# Patient Record
Sex: Male | Born: 1965 | Race: White | Hispanic: No | Marital: Single | Smoking: Current every day smoker
Health system: Southern US, Community
[De-identification: ages and names within clinical notes are randomized; demographics above are authoritative.]

## PROBLEM LIST (undated history)

## (undated) DIAGNOSIS — K449 Diaphragmatic hernia without obstruction or gangrene: Secondary | ICD-10-CM

## (undated) DIAGNOSIS — K2970 Gastritis, unspecified, without bleeding: Secondary | ICD-10-CM

## (undated) DIAGNOSIS — K2270 Barrett's esophagus without dysplasia: Secondary | ICD-10-CM

## (undated) DIAGNOSIS — K5792 Diverticulitis of intestine, part unspecified, without perforation or abscess without bleeding: Secondary | ICD-10-CM

## (undated) DIAGNOSIS — F329 Major depressive disorder, single episode, unspecified: Secondary | ICD-10-CM

## (undated) DIAGNOSIS — K219 Gastro-esophageal reflux disease without esophagitis: Secondary | ICD-10-CM

## (undated) DIAGNOSIS — J45909 Unspecified asthma, uncomplicated: Secondary | ICD-10-CM

## (undated) DIAGNOSIS — F32A Depression, unspecified: Secondary | ICD-10-CM

## (undated) DIAGNOSIS — F419 Anxiety disorder, unspecified: Secondary | ICD-10-CM

## (undated) DIAGNOSIS — I10 Essential (primary) hypertension: Secondary | ICD-10-CM

## (undated) HISTORY — DX: Unspecified asthma, uncomplicated: J45.909

## (undated) HISTORY — PX: CHOLECYSTECTOMY: SHX55

## (undated) HISTORY — DX: Gastro-esophageal reflux disease without esophagitis: K21.9

## (undated) HISTORY — DX: Essential (primary) hypertension: I10

## (undated) HISTORY — DX: Barrett's esophagus without dysplasia: K22.70

---

## 2003-11-21 ENCOUNTER — Emergency Department (HOSPITAL_COMMUNITY): Admission: EM | Admit: 2003-11-21 | Discharge: 2003-11-22 | Payer: Self-pay | Admitting: Emergency Medicine

## 2008-04-20 ENCOUNTER — Emergency Department (HOSPITAL_COMMUNITY): Admission: EM | Admit: 2008-04-20 | Discharge: 2008-04-21 | Payer: Self-pay | Admitting: Emergency Medicine

## 2008-05-30 ENCOUNTER — Emergency Department (HOSPITAL_COMMUNITY): Admission: EM | Admit: 2008-05-30 | Discharge: 2008-05-30 | Payer: Self-pay | Admitting: Emergency Medicine

## 2008-06-15 ENCOUNTER — Ambulatory Visit: Payer: Self-pay | Admitting: Hematology and Oncology

## 2008-07-16 ENCOUNTER — Emergency Department (HOSPITAL_COMMUNITY): Admission: EM | Admit: 2008-07-16 | Discharge: 2008-07-17 | Payer: Self-pay | Admitting: Emergency Medicine

## 2008-07-17 ENCOUNTER — Ambulatory Visit: Payer: Self-pay | Admitting: Psychiatry

## 2008-07-17 ENCOUNTER — Inpatient Hospital Stay (HOSPITAL_COMMUNITY): Admission: EM | Admit: 2008-07-17 | Discharge: 2008-07-19 | Payer: Self-pay | Admitting: Psychiatry

## 2010-10-04 IMAGING — CT CT ANGIO CHEST
2 of 6 series · 19 of 36 positions shown · IV contrast (APPLIED)
Comparison: Chest radiography same day

CLINICAL DATA: Short of breath.  Right chest pain.  Smoking
history.  Elevated white count.  Abnormal D-dimer.

CT ANGIOGRAPHY CHEST
TECHNIQUE: Multidetector CT imaging of the chest using the
standard protocol during bolus administration of intravenous
contrast. Multiplanar reconstructed images including MIPs were
obtained and reviewed to evaluate the vascular anatomy.
Contrast: 80 ml [id number]

[Series 7: pe 1.0 b40f thins for pacs · axial · 0.84mm/px · z∈[-114,+136]mm · 18 of 278 slices shown]
[im 14/278  lung]
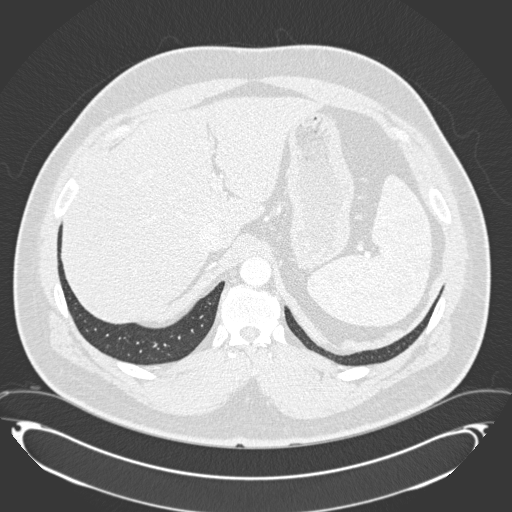
[im 28/278  mediastinal]
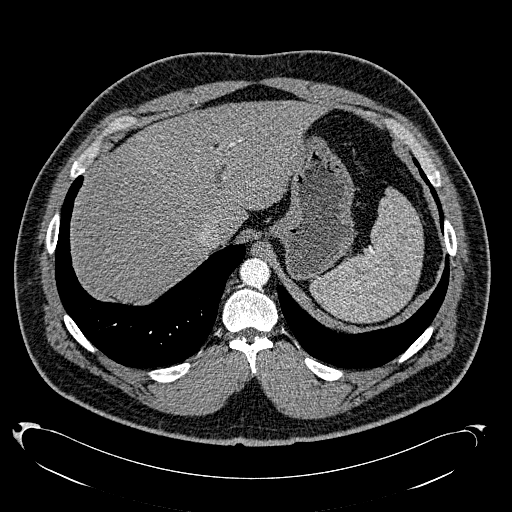
[im 42/278  lung]
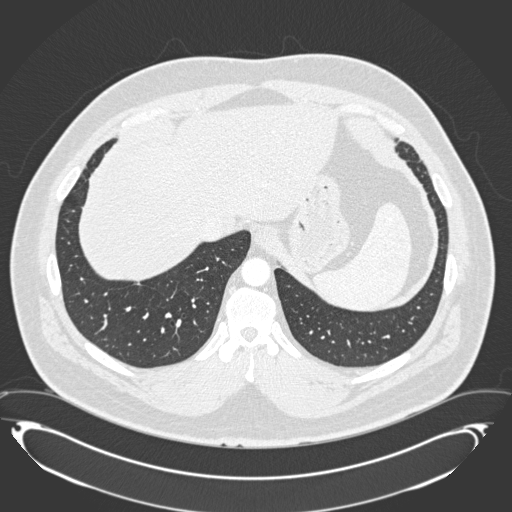
[im 56/278  mediastinal]
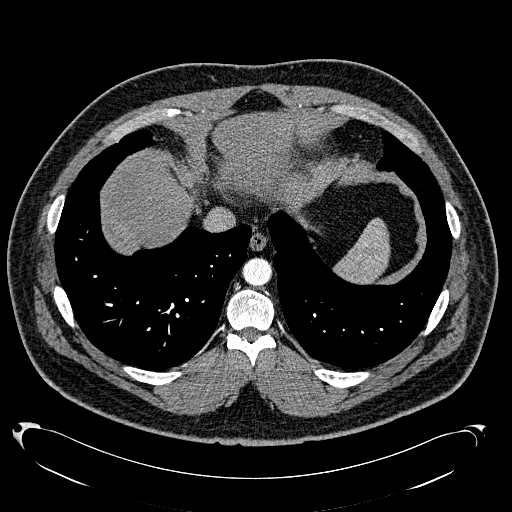
[im 70/278  lung]
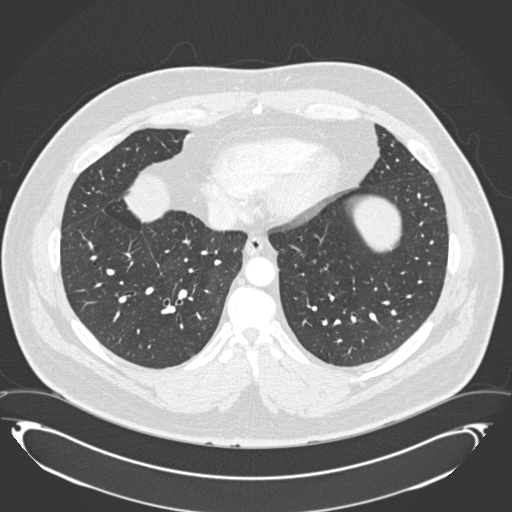
[im 84/278  mediastinal]
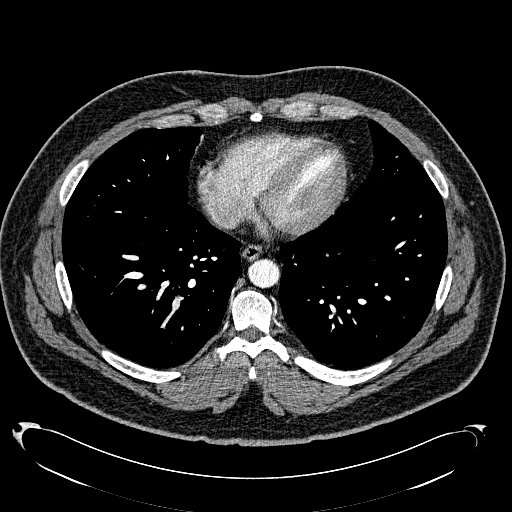
[im 97/278  lung]
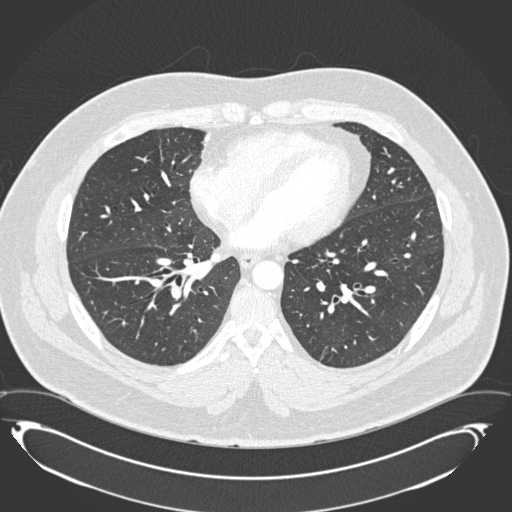
[im 111/278  mediastinal]
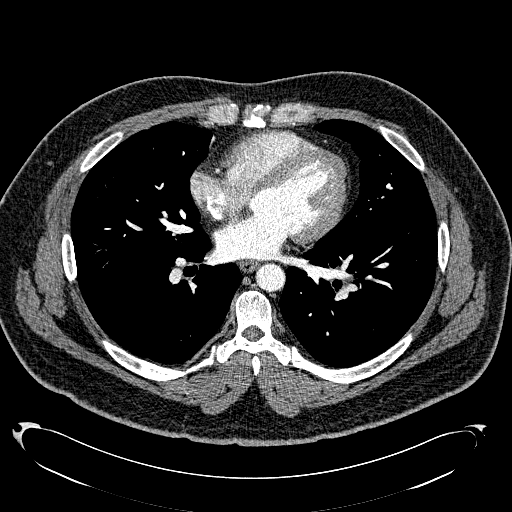
[im 125/278  lung]
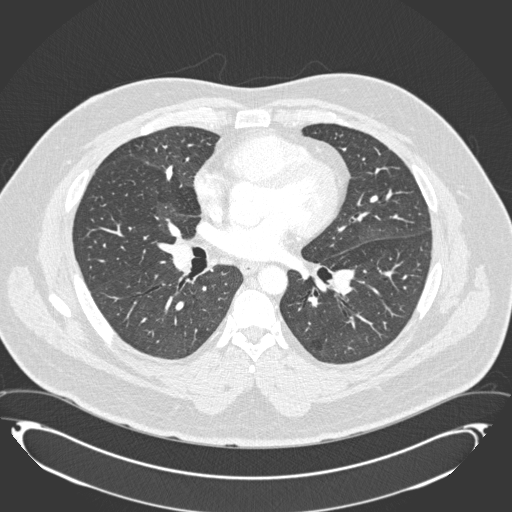
[im 153/278  mediastinal]
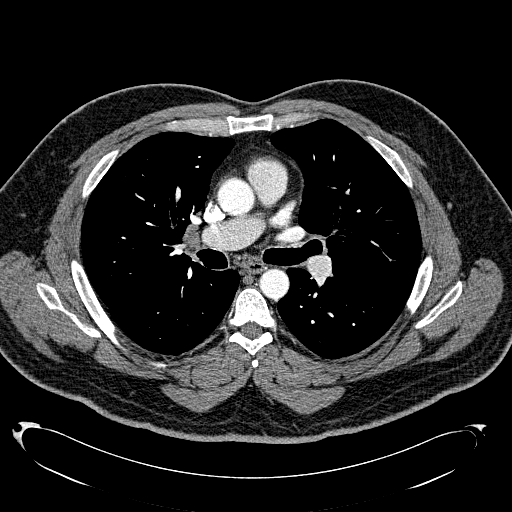
[im 167/278  lung]
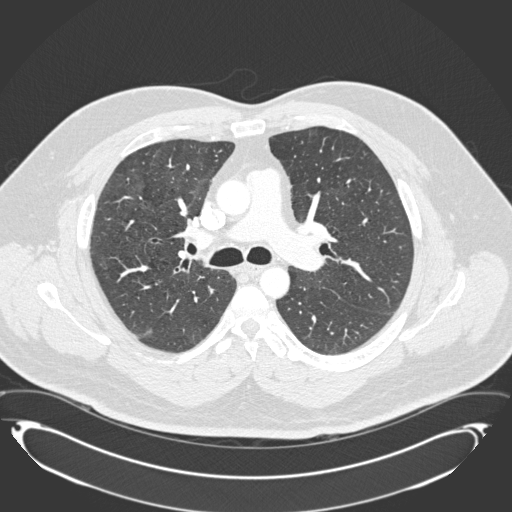
[im 181/278  mediastinal]
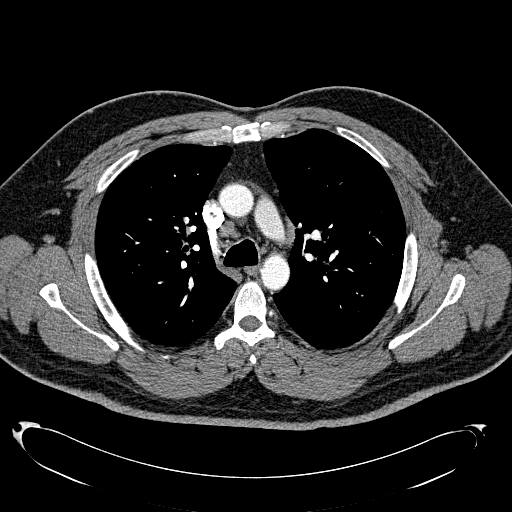
[im 194/278  lung]
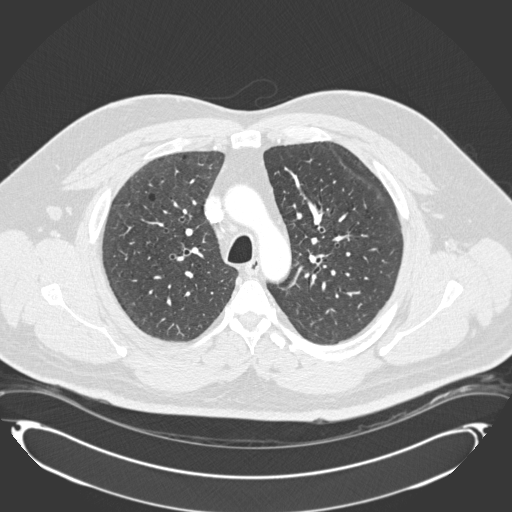
[im 208/278  mediastinal]
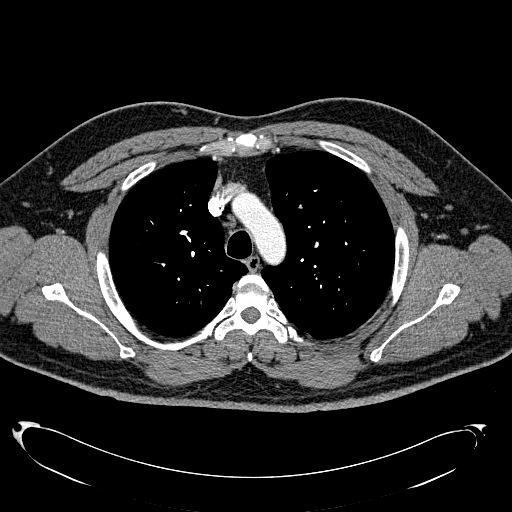
[im 222/278  lung]
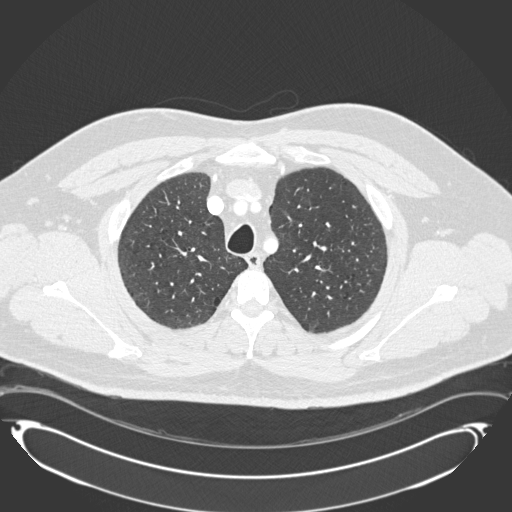
[im 236/278  mediastinal]
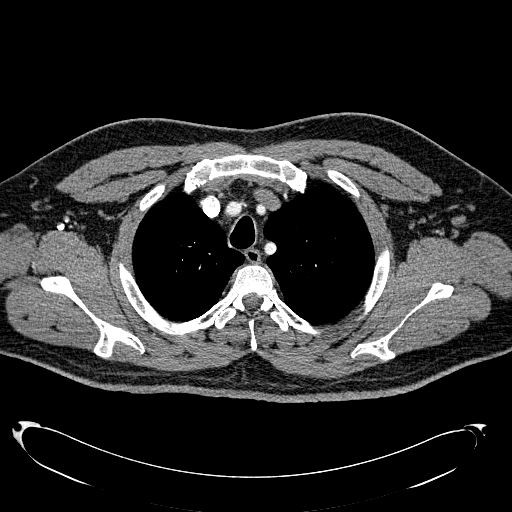
[im 250/278  lung]
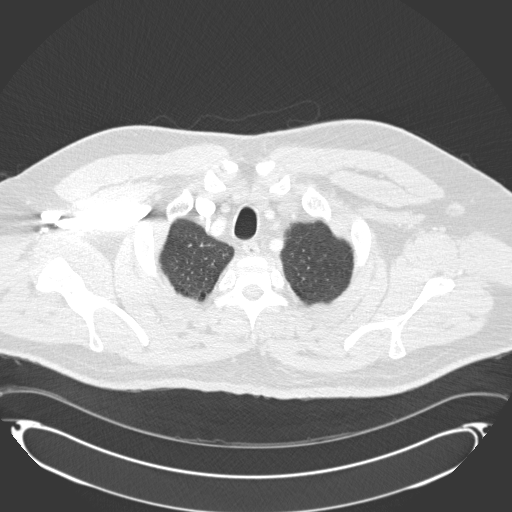
[im 264/278  mediastinal]
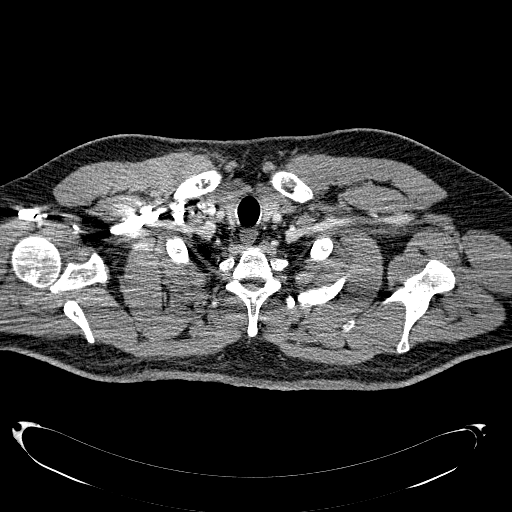

[Series 604: <mpr thick range> · coronal · 0.84mm/px · 1 of 114 slices shown]
[im 57/114  mediastinal]
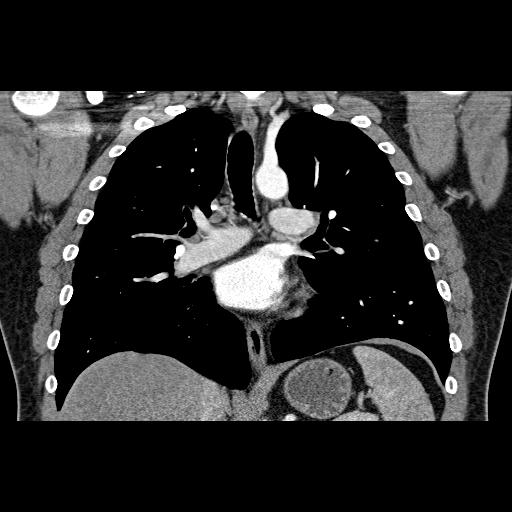

[19 of 36 positions shown; findings below may reference images not displayed]

FINDINGS: Pulmonary arterial opacification there is moderate.  No
filling defects are seen to suggest emboli.  The aorta appears
normal.  No pleural or pericardial fluid.  There are no
pathologically enlarged mediastinal or hilar lymph nodes.  There is
early emphysema and there is pulmonary opacity probably related to
respiratory bronchiolitis of smoking.  No identifiable
consolidation, collapse or mass lesion.  No osseous lesions seen.
Scans in the upper abdomen are unremarkable.
IMPRESSION: No acute process.  Chronic pulmonary findings related to  smoking
including early emphysema and areas of hazy opacity consistent with
respiratory bronchiolitis of smoking.

## 2011-01-06 NOTE — H&P (Signed)
Bryan Collins, Bryan Collins                ACCOUNT NO.:  [id number]   MEDICAL RECORD NO.:  [id number]          PATIENT TYPE:  IPS   LOCATION:  0507                          FACILITY:  [facility]   PHYSICIAN:  Bryan Collins, M.D.      DATE OF BIRTH:  Dec 27, 1965   DATE OF ADMISSION:  07/16/2008  DATE OF DISCHARGE:                       PSYCHIATRIC ADMISSION ASSESSMENT   IDENTIFICATION:  A 45 year old Caucasian male.  This is an involuntary  admission.   HISTORY OF PRESENT ILLNESS:  First [facility] admission for this 45 year old  who presented in the emergency room reporting that he has intentionally  drunk Raid, sprang it from a can into his mouth.  Endorsed intent to  harm himself, said that he has been very angry with his family, has been  arguing and destroyed his grandmother's house where he resides this  weekend saying that he wanted his parents to understand the kind of pain  and loneliness that he has been suffering [place] and no means for social contact.  He is not currently  under the care of a psychiatrist, is prescribed Effexor XR 75 mg daily  by his primary care practitioner for depression.  The patient's mother  has reported that he did break several things on the front porch and has  been threatening to kill myself for months.  The patient did raise has  voice to security guards in the emergency room and was petitioned when  he threatened to leave.  The patient reports that for the past 7 years  he has been living in his grandmother's house along with his twin  brother.  The house was purchased for them from his grandmother's estate  by his mother after the grandmother died.  Prior to that, his parents  were paying for he and his brother to live in crack motels.  Denying  homicidal thoughts.  He denies substance abuse.  He endorses several  months of increased anger and irritability, feeling lonely, appetite has  been decreased.  He has lost  approximately 25 pounds in the past 3  months, increased tearful with isolating himself, no interest in life  and becoming more irritable.   PAST PSYCHIATRIC HISTORY:  First inpatient psychiatric admission.  Has  been treated by his primary care practitioner for depression.  Denies  any other hospitalizations or prior treatment.  Denies a history of  substance abuse.  He endorses a history of prior suicide attempts  including twice.  Says that he overdosed one time in the distant past  because his girlfriend refused to have sex with him, and has also been  depressed in the past related to family problems and conflicts.   SOCIAL HISTORY:  Single Caucasian male, never married.  No children .  living with his identical twin brother for the past 7-1/2 years in his  grandmother's [place].  They receive food stamps, basic education,  currently unemployed.  Mother apparently manages his medications.  Other  source of income is unclear.   FAMILY HISTORY:  He does have an identical twin brother, also unemployed  and disabled and lives in the [place] with him.   ALCOHOL AND DRUG HISTORY:  He denies substance abuse, currently smokes  two and a half packs per day of tobacco.   MEDICAL HISTORY:  Primary care practitioner is Dr. Leslee Collins.   MEDICAL PROBLEMS:  None   CURRENT MEDICATIONS:  Current medications have not been validated with  his mother yet who manages his medications.  He reports Klonopin 1 mg  t.i.d., Effexor XR 75 mg daily.   DRUG ALLERGIES:  Penicillin.   POSITIVE PHYSICAL FINDINGS:  Physical exam was done in the emergency  room as noted in the record.  He did have some gagging from the  insecticide that he had swallowed.  It showed no signs of respiratory  depression.  Chemistries all within normal limits.  Liver enzymes  normal.  Alcohol level less than 5.  CBC:  WBC 13.5, hemoglobin 18.6,  hematocrit 56.0 and platelets 171,000.  His urine drug screen was  negative for all  substances.   MENTAL STATUS EXAM:  Blunt affect rather grim affect.  Fully alert,  cooperative, in full contact with reality.  Speech is logical, relevant,  unremarkable in form.  Mood irritable.  Thought process logical,  coherent.  Insight is adequate.  Endorsing suicidal thought.  No  homicidal thoughts.  Memory is intact, fully oriented, asking for help,  has been cooperative here with staff and been participating in groups.  Insight seems limited, willing to consider medications to help with his  mood and temperament.  Asking for help.   DIAGNOSES:  AXIS I:  Mood disorder NOS.  AXIS II:  Deferred.  AXIS III:  Status post insecticide ingestion.  AXIS IV:  Severe problems with social isolation.  AXIS V:  Current 48,  past year not known.   PLAN:  Voluntarily admit him to alleviate some of his depression and  agitation.  We are going to continue his Effexor.  At this point, we  will add Depakote 250 mg now 500 mg at bedtime, and will contact his  mother to validate his medications.  Meanwhile, we are going to check an  RPR, a TSH and free T4.      Bryan Collins, N.P.      Bryan [place], M.D.  Electronically Signed    MAS/MEDQ  D:  07/17/2008  T:  07/17/2008  Job:  [id number]

## 2011-01-09 NOTE — Discharge Summary (Signed)
NAMEPAVEL, GADD NO.:  [id number]   MEDICAL RECORD NO.:  [id number]          PATIENT TYPE:  IPS   LOCATION:  0507                          FACILITY:  [facility]   PHYSICIAN:  Geoffery Lyons, M.D.      DATE OF BIRTH:  [birth year]   DATE OF ADMISSION:  07/17/2008  DATE OF DISCHARGE:  07/19/2008                               DISCHARGE SUMMARY   CHIEF COMPLAINT AND PRESENT ILLNESS:  This was the first admission to  [facility] for this 45 year old male who presented in  the emergency room reporting that he had intentionally drunk Raid,  spraying it from a can into his mouth.  Endorsed intent to harm himself.  He was very angry with his family.  He has been arguing and destroyed  his grandmother's house, where he resides.  He wanted his parents to  understand the kind of pain and loneliness that he has been suffering  [place] the country with no transportation, no means for social  contact.  He is not seeing a psychiatrist at this particular time, being  prescribed Effexor by his primary physician.  The mother reports that he  did break several things on the front porch.  He has been threatening to  kill himself for months.  For the past 7 years, he has been living in  his grandmother's house, along with his twin brother.  Prior to being in  this house, his parents were pain for him and his brother to live in  crack __________.  Endorsed anger, irritability, and feeling lonely.   PAST PSYCHIATRIC HISTORY:  First inpatient.  Had been treated by his  primary care Gracieann Stannard.  No previous inpatient.  Endorsed a history of  prior suicide attempts.  Overdose one time in the distant past because  his girlfriend refused to have sex with him.   ALCOHOL AND DRUG HISTORY:  Denies any active use of any substances.   MEDICAL HISTORY:  Noncontributory.   MEDICATIONS:  1. Reports Klonopin 1 mg 3 times a day.  2. Effexor XR 75 mg per day.   PHYSICAL EXAMINATION:   Failed to show any acute findings.   LABORATORY WORKUP:  TSH 0.526.   MENTAL STATUS EXAM:  Reveals an alert, cooperative male, in full contact  with reality.  Speech was logical and relevant.  Mood irritable.  Affect  irritable.  Thought processes logical, coherent, and relevant.  Endorsed  no active suicidal or homicidal ideas, no hallucinations or delusions.  Endorsed being upset with the situation that he is in at the house with  his brother and not being able to have transportation.  A lot of  projection, not claiming responsibility for his behavior, a lot of  rationalization.   ADMITTING DIAGNOSES:  AXIS I:  Mood disorder, not otherwise specified.  AXIS II:  Personality disorder, not otherwise specified.  AXIS III:  No diagnosis.  AXIS IV:  Moderate.  AXIS V:  Upon admission 35-40.  Highest global assessment of function in  the last year 60.   COURSE IN THE HOSPITAL:  The patient was admitted.  He was started on  individual and group psychotherapy.  As already stated, he endorsed he  had been taking Klonopin 6 mg for 9 years.  As of late, he has not been  eating, feeling depressed, hopeless, helpless.  He was on Zoloft for 2  or 3 years, but it was not working anymore.  Switched to Effexor.  Admits that he has had angry violent outbursts.  Has been off for 20  days, but the depression has been getting worse.  He had been on  Cymbalta, Celexa, Seroquel. There was a family session with his mother.  Apparently, he had been worse since starting the Effexor, but he has had  anger issues for years.  He has not worked for 8 years, and the mother  pays all of his bills, as she also does for his brother.  He also  destroyed the cell phone that his mother bought for him.  The patient  has no insurance, no income, and has not attempted to work in 8 years.  He reports boredom.  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  The mother did report that she  had to stop  enabling, and the patient did not accept any of the recommendation given  by the counselor.  He claimed he wanted to get his life back together.  Endorsed he was going to go to counseling and planned to be more active,  asking about disability.  On July 19, 2008, he was in full contact  reality.  There were no active suicidal or homicidal ideations.  No  delusions.  Endorsed he was willing to pursue further treatment.  He was  placed on Depakote 250 in the morning and 2 at bedtime.  He was  maintained on the Klonopin, with recommendation that the Klonopin was to  be discontinued on an outpatient basis.   DISCHARGE DIAGNOSES:  AXIS I:  1.  Mood disorder, not otherwise  specified.  2.  Anxiety disorder, not otherwise specified.  AXIS II:  Personality disorder, not otherwise specified.  AXIS III:  No diagnosis.  AXIS IV:  Moderate.  AXIS V:  Upon discharge 50.   DISCHARGED ON:  1. Depakote 250 in the morning, and 500 at bedtime.  2. Klonopin 1 mg 1 three times a day as needed for anxiety.  3. Ambien 10 at bedtime for sleep.   FOLLOWUP:  [facility].      Geoffery Lyons, M.D.  Electronically Signed     IL/MEDQ  D:  08/15/2008  T:  08/16/2008  Job:  [id number]

## 2011-03-19 ENCOUNTER — Emergency Department (HOSPITAL_COMMUNITY)
Admission: EM | Admit: 2011-03-19 | Discharge: 2011-03-19 | Disposition: A | Discharge status: Home / Self Care | Payer: Medicaid Other | Attending: Emergency Medicine | Admitting: Emergency Medicine

## 2011-03-19 ENCOUNTER — Emergency Department (HOSPITAL_COMMUNITY): Payer: Medicaid Other

## 2011-03-19 DIAGNOSIS — R1011 Right upper quadrant pain: Secondary | ICD-10-CM | POA: Insufficient documentation

## 2011-03-19 DIAGNOSIS — Z9089 Acquired absence of other organs: Secondary | ICD-10-CM | POA: Insufficient documentation

## 2011-03-19 DIAGNOSIS — G8918 Other acute postprocedural pain: Secondary | ICD-10-CM | POA: Insufficient documentation

## 2011-03-19 LAB — COMPREHENSIVE METABOLIC PANEL
ALT: 21 U/L (ref 0–53)
Albumin: 3.4 g/dL — ABNORMAL LOW (ref 3.5–5.2)
Alkaline Phosphatase: 93 U/L (ref 39–117)
BUN: 12 mg/dL (ref 6–23)
Chloride: 103 mEq/L (ref 96–112)
Potassium: 3.7 mEq/L (ref 3.5–5.1)
Total Bilirubin: 0.3 mg/dL (ref 0.3–1.2)

## 2011-03-19 LAB — URINALYSIS, ROUTINE W REFLEX MICROSCOPIC
Hgb urine dipstick: NEGATIVE
Nitrite: NEGATIVE
Specific Gravity, Urine: 1.026 (ref 1.005–1.030)
pH: 5.5 (ref 5.0–8.0)

## 2011-03-19 LAB — DIFFERENTIAL
Lymphocytes Relative: 19 % (ref 12–46)
Lymphs Abs: 2.7 10*3/uL (ref 0.7–4.0)
Neutrophils Relative %: 74 % (ref 43–77)

## 2011-03-19 LAB — CBC
HCT: 50.1 % (ref 39.0–52.0)
MCV: 87.4 fL (ref 78.0–100.0)
Platelets: 157 10*3/uL (ref 150–400)
RBC: 5.73 MIL/uL (ref 4.22–5.81)
WBC: 14.3 10*3/uL — ABNORMAL HIGH (ref 4.0–10.5)

## 2011-03-19 LAB — LIPASE, BLOOD: Lipase: 28 U/L (ref 11–59)

## 2011-03-19 MED ORDER — IOHEXOL 300 MG/ML  SOLN
100.0000 mL | Freq: Once | INTRAMUSCULAR | Status: AC | PRN
  Administered 2011-03-19: 100 mL via INTRAVENOUS

## 2011-05-25 LAB — BLOOD GAS, ARTERIAL
FIO2: 0.21
O2 Saturation: 90.5
Patient temperature: 98.6
TCO2: 20.6
pH, Arterial: 7.379

## 2011-05-25 LAB — COMPREHENSIVE METABOLIC PANEL
Albumin: 3.6
BUN: 7
Creatinine, Ser: 1.16
Glucose, Bld: 89
Total Protein: 6.9

## 2011-05-25 LAB — CBC
HCT: 53.4 — ABNORMAL HIGH
MCHC: 34.3
MCV: 92.8
Platelets: 152
RDW: 14.7

## 2011-05-25 LAB — D-DIMER, QUANTITATIVE: D-Dimer, Quant: 0.25

## 2011-05-25 LAB — DIFFERENTIAL
Basophils Absolute: 0
Basophils Relative: 0
Eosinophils Absolute: 0.2
Eosinophils Relative: 1

## 2011-05-25 LAB — POCT CARDIAC MARKERS
CKMB, poc: <1 — ABNORMAL LOW
Myoglobin, poc: 80.8

## 2011-05-25 LAB — LIPASE, BLOOD: Lipase: 60 — ABNORMAL HIGH

## 2011-05-26 LAB — CBC
HCT: 56 — ABNORMAL HIGH
Platelets: 171
RDW: 13.7
WBC: 13.5 — ABNORMAL HIGH

## 2011-05-26 LAB — RAPID URINE DRUG SCREEN, HOSP PERFORMED
Benzodiazepines: NOT DETECTED
Cocaine: NOT DETECTED
Tetrahydrocannabinol: NOT DETECTED

## 2011-05-26 LAB — COMPREHENSIVE METABOLIC PANEL
ALT: 18
AST: 18
Albumin: 3.8
Alkaline Phosphatase: 87
BUN: 4 — ABNORMAL LOW
Chloride: 105
Potassium: 4.1
Sodium: 137
Total Bilirubin: 0.5
Total Protein: 7

## 2011-05-26 LAB — T4, FREE: Free T4: 1.03

## 2011-05-26 LAB — SALICYLATE LEVEL: Salicylate Lvl: 9.7

## 2011-05-26 LAB — DIFFERENTIAL
Basophils Absolute: 0
Basophils Relative: 0
Eosinophils Absolute: 0.1
Eosinophils Relative: 1
Monocytes Absolute: 0.8
Monocytes Relative: 6
Neutro Abs: 10.5 — ABNORMAL HIGH

## 2011-05-26 LAB — ETHANOL: Alcohol, Ethyl (B): <5

## 2011-05-26 LAB — TSH: TSH: 0.526

## 2012-03-09 ENCOUNTER — Ambulatory Visit: Payer: Medicaid Other | Admitting: Family Medicine

## 2012-03-22 ENCOUNTER — Ambulatory Visit: Payer: Medicaid Other | Admitting: Family Medicine

## 2012-04-06 ENCOUNTER — Ambulatory Visit: Payer: Medicaid Other | Admitting: Family Medicine

## 2012-04-11 ENCOUNTER — Ambulatory Visit: Payer: Medicaid Other | Admitting: Family Medicine

## 2014-05-08 ENCOUNTER — Emergency Department (HOSPITAL_COMMUNITY)
Admission: EM | Admit: 2014-05-08 | Discharge: 2014-05-08 | Disposition: A | Discharge status: Home / Self Care | Payer: Medicaid Other | Attending: Emergency Medicine | Admitting: Emergency Medicine

## 2014-05-08 ENCOUNTER — Encounter (HOSPITAL_COMMUNITY): Payer: Self-pay | Admitting: Emergency Medicine

## 2014-05-08 DIAGNOSIS — IMO0002 Reserved for concepts with insufficient information to code with codable children: Secondary | ICD-10-CM | POA: Diagnosis not present

## 2014-05-08 DIAGNOSIS — J45901 Unspecified asthma with (acute) exacerbation: Secondary | ICD-10-CM | POA: Diagnosis not present

## 2014-05-08 DIAGNOSIS — Z7982 Long term (current) use of aspirin: Secondary | ICD-10-CM | POA: Diagnosis not present

## 2014-05-08 DIAGNOSIS — R1031 Right lower quadrant pain: Secondary | ICD-10-CM | POA: Diagnosis present

## 2014-05-08 DIAGNOSIS — Z79899 Other long term (current) drug therapy: Secondary | ICD-10-CM | POA: Diagnosis not present

## 2014-05-08 DIAGNOSIS — G8929 Other chronic pain: Secondary | ICD-10-CM | POA: Diagnosis not present

## 2014-05-08 DIAGNOSIS — Z9089 Acquired absence of other organs: Secondary | ICD-10-CM | POA: Insufficient documentation

## 2014-05-08 DIAGNOSIS — F329 Major depressive disorder, single episode, unspecified: Secondary | ICD-10-CM | POA: Diagnosis not present

## 2014-05-08 DIAGNOSIS — F3289 Other specified depressive episodes: Secondary | ICD-10-CM | POA: Diagnosis not present

## 2014-05-08 DIAGNOSIS — F411 Generalized anxiety disorder: Secondary | ICD-10-CM

## 2014-05-08 DIAGNOSIS — Z880 Allergy status to penicillin: Secondary | ICD-10-CM | POA: Insufficient documentation

## 2014-05-08 DIAGNOSIS — R0602 Shortness of breath: Secondary | ICD-10-CM

## 2014-05-08 HISTORY — DX: Depression, unspecified: F32.A

## 2014-05-08 HISTORY — DX: Anxiety disorder, unspecified: F41.9

## 2014-05-08 HISTORY — DX: Major depressive disorder, single episode, unspecified: F32.9

## 2014-05-08 LAB — URINALYSIS, ROUTINE W REFLEX MICROSCOPIC
BILIRUBIN URINE: NEGATIVE
Glucose, UA: NEGATIVE mg/dL
HGB URINE DIPSTICK: NEGATIVE
KETONES UR: NEGATIVE mg/dL
Leukocytes, UA: NEGATIVE
NITRITE: NEGATIVE
PROTEIN: NEGATIVE mg/dL
Specific Gravity, Urine: 1.013 (ref 1.005–1.030)
UROBILINOGEN UA: 0.2 mg/dL (ref 0.0–1.0)
pH: 6 (ref 5.0–8.0)

## 2014-05-08 LAB — COMPREHENSIVE METABOLIC PANEL
ALBUMIN: 3.7 g/dL (ref 3.5–5.2)
ALK PHOS: 85 U/L (ref 39–117)
ALT: 15 U/L (ref 0–53)
ANION GAP: 11 (ref 5–15)
AST: 16 U/L (ref 0–37)
BILIRUBIN TOTAL: 0.3 mg/dL (ref 0.3–1.2)
BUN: 9 mg/dL (ref 6–23)
CHLORIDE: 102 meq/L (ref 96–112)
CO2: 25 mEq/L (ref 19–32)
CREATININE: 0.98 mg/dL (ref 0.50–1.35)
Calcium: 9.3 mg/dL (ref 8.4–10.5)
GLUCOSE: 86 mg/dL (ref 70–99)
POTASSIUM: 4.2 meq/L (ref 3.7–5.3)
Sodium: 138 mEq/L (ref 137–147)
Total Protein: 7.5 g/dL (ref 6.0–8.3)

## 2014-05-08 LAB — CBC WITH DIFFERENTIAL/PLATELET
BASOS PCT: 0 % (ref 0–1)
Basophils Absolute: 0 10*3/uL (ref 0.0–0.1)
Eosinophils Absolute: 0.2 10*3/uL (ref 0.0–0.7)
Eosinophils Relative: 1 % (ref 0–5)
HEMATOCRIT: 51.6 % (ref 39.0–52.0)
HEMOGLOBIN: 18.1 g/dL — AB (ref 13.0–17.0)
LYMPHS ABS: 1.9 10*3/uL (ref 0.7–4.0)
Lymphocytes Relative: 17 % (ref 12–46)
MCH: 31.2 pg (ref 26.0–34.0)
MCHC: 35.1 g/dL (ref 30.0–36.0)
MCV: 89 fL (ref 78.0–100.0)
MONO ABS: 0.8 10*3/uL (ref 0.1–1.0)
MONOS PCT: 7 % (ref 3–12)
NEUTROS ABS: 8.5 10*3/uL — AB (ref 1.7–7.7)
NEUTROS PCT: 75 % (ref 43–77)
Platelets: 157 10*3/uL (ref 150–400)
RBC: 5.8 MIL/uL (ref 4.22–5.81)
RDW: 14.2 % (ref 11.5–15.5)
WBC: 11.4 10*3/uL — ABNORMAL HIGH (ref 4.0–10.5)

## 2014-05-08 LAB — PRO B NATRIURETIC PEPTIDE: PRO B NATRI PEPTIDE: 84.3 pg/mL (ref 0–125)

## 2014-05-08 MED ORDER — MORPHINE SULFATE 4 MG/ML IJ SOLN
4.0000 mg | Freq: Once | INTRAMUSCULAR | Status: AC
  Administered 2014-05-08: 4 mg via INTRAVENOUS
  Filled 2014-05-08: qty 1

## 2014-05-08 MED ORDER — NAPROXEN 500 MG PO TABS: 500.0000 mg | ORAL_TABLET | Freq: Two times a day (BID) | ORAL | Status: DC | PRN

## 2014-05-08 MED ORDER — OXYCODONE-ACETAMINOPHEN 5-325 MG PO TABS: 1.0000 | ORAL_TABLET | Freq: Four times a day (QID) | ORAL | Status: DC | PRN

## 2014-05-08 MED ORDER — SODIUM CHLORIDE 0.9 % IV BOLUS (SEPSIS)
1000.0000 mL | Freq: Once | INTRAVENOUS | Status: AC
  Administered 2014-05-08: 1000 mL via INTRAVENOUS

## 2014-05-08 MED ORDER — OMEPRAZOLE 20 MG PO CPDR: 20.0000 mg | DELAYED_RELEASE_CAPSULE | Freq: Every day | ORAL | Status: DC

## 2014-05-08 NOTE — Discharge Instructions (Signed)
Abdominal (belly) pain can be caused by many things. Yours is likely related to your diverticulosis. You need to increase the water and fiber in your diet. Use naprosyn and norco as directed as needed for pain. Use prilosec daily to help with your reflux symptoms. Your caregiver performed an examination and possibly ordered blood/urine tests and imaging (CT scan, x-rays, ultrasound). Many cases can be observed and treated at home after initial evaluation in the emergency department. Even though you are being discharged home, abdominal pain can be unpredictable. Therefore, you need a repeated exam if your pain does not resolve, returns, or worsens. Most patients with abdominal pain don't have to be admitted to the hospital or have surgery, but serious problems like appendicitis and gallbladder attacks can start out as nonspecific pain. Many abdominal conditions cannot be diagnosed in one visit, so follow-up evaluations are very important. SEEK IMMEDIATE MEDICAL ATTENTION IF YOU DEVELOP ANY OF THE FOLLOWING SYMPTOMS:  The pain does not go away or becomes severe.   A temperature above 101 develops.   Repeated vomiting occurs (multiple episodes).   The pain becomes localized to portions of the abdomen. The right side could possibly be appendicitis. In an adult, the left lower portion of the abdomen could be colitis or diverticulitis.   Blood is being passed in stools or vomit (bright red or black tarry stools).   Return also if you develop chest pain, difficulty breathing, dizziness or fainting, or become confused, poorly responsive, or inconsolable (young children).  The constipation stays for more than 4 days.   There is belly (abdominal) or rectal pain.   You do not seem to be getting better.

## 2014-05-08 NOTE — ED Provider Notes (Signed)
CSN: [id number]     Arrival date & time 05/08/14  1439 History   First MD Initiated Contact with Patient 05/08/14 1552     Chief Complaint  Patient presents with  . Abdominal Pain  . Shortness of Breath     (Consider location/radiation/quality/duration/timing/severity/associated sxs/prior Treatment) HPI Comments: Bryan [place] is a 48 y.o. male with a PMHx of depression, anxiety, asthma, and diverticulosis, and a PSHx of cholecystectomy, who presents to the ED with complaints of ongoing abd pain x6 months which is improving but has not subsided fully. States he was in [place] 6 months ago and was diagnosed with diverticulitis, treated with pain meds and antibiotics, and since then he's been having ongoing abd pain intermittently without known inciting cause. Reports the pain is 6/10 sharp stabbing when it occurs, then becomes dull, usually occurs on his right lateral abdomen but will occasionally become generalized. States it's unassociated with food intake or BMs. States today he came into the ED because although the pain is improving, he's still having GI discomfort over the last few months and he's "tired of it". States he has ongoing GERD symptoms which he's been using prilosec and pepto bismol for with relief. Denies any changes in his pain, fevers, chills, CP, cough, n/v/d/c, hematochezia, hematuria, hematemesis, melena, painful BMs, rectal bleeding, recent travel or suspicious food intake. States he uses ASA daily, 325mg  x10 tabs, usually on an empty stomach. Drinks 2 pots of coffee daily. Smokes 2ppd x53yrs. Reports he's never see a GI doctor after his initial diverticulitis incident. States the pain he's been experiencing is not anywhere as severe as what it felt like 6 months ago.  Additionally, pt complains of ongoing SOB x2-3 years, occurring sporadically and intermittently, worse with smoking and anxiety. Uses advair daily, and stopped using his albuterol inhaler because he was having  jitteriness and didn't think it was helping. States he's not having any SOB at this time, but has it daily. Denies LE swelling, inspiratory pain, CP, hemoptysis, recent travel, diaphoresis, jaw/arm/back pain, weakness, or paresthesias.   Patient is a 48 y.o. male presenting with abdominal pain. The history is provided by the patient. No language interpreter was used.  Abdominal Pain Pain location:  RLQ Pain quality: stabbing   Pain radiates to:  Does not radiate Pain severity:  Mild (6/10) Onset quality:  Gradual Duration: 6 months. Timing:  Sporadic Progression:  Improving Chronicity:  Recurrent Context: not recent travel, not sick contacts and not suspicious food intake   Relieved by:  None tried Worsened by:  Nothing tried Ineffective treatments:  None tried Associated symptoms: shortness of breath (intermittent, ongoing for months)   Associated symptoms: no anorexia, no belching, no chest pain, no chills, no constipation, no cough, no diarrhea, no dysuria, no fatigue, no fever, no flatus, no hematemesis, no hematochezia, no hematuria, no melena, no nausea, no sore throat and no vomiting   Shortness of breath:    Severity:  Mild   Onset quality:  Gradual   Duration: 2-3 years.   Timing:  Sporadic   Progression:  Unchanged Risk factors: aspirin and NSAID use     Past Medical History  Diagnosis Date  . Depression   . Anxiety    Past Surgical History  Procedure Laterality Date  . Cholecystectomy     No family history on file. History  Substance Use Topics  . Smoking status: Never Smoker   . Smokeless tobacco: Not on file  . Alcohol Use: No  Review of Systems  Constitutional: Negative for fever, chills and fatigue.  HENT: Negative for sore throat.   Respiratory: Positive for shortness of breath (intermittent, ongoing for months). Negative for cough and wheezing.   Cardiovascular: Negative for chest pain, palpitations and leg swelling.  Gastrointestinal: Positive for  abdominal pain. Negative for nausea, vomiting, diarrhea, constipation, blood in stool, melena, hematochezia, abdominal distention, anal bleeding, rectal pain, anorexia, flatus and hematemesis.  Genitourinary: Negative for dysuria, urgency and hematuria.  Musculoskeletal: Negative for arthralgias, back pain and myalgias.  Skin: Negative for rash.  Neurological: Negative for dizziness, weakness and numbness.  10 Systems reviewed and are negative for acute change except as noted in the HPI.     Allergies  Penicillins  Home Medications   Prior to Admission medications   Medication Sig Start Date End Date Taking? Authorizing Provider  aspirin 325 MG tablet Take 1,625 mg by mouth 2 (two) times daily.   Yes Historical Provider, MD  clonazePAM (KLONOPIN) 1 MG tablet Take 1 mg by mouth 4 (four) times daily.   Yes Historical Provider, MD  Fluticasone-Salmeterol (ADVAIR) 250-50 MCG/DOSE AEPB Inhale 1 puff into the lungs 2 (two) times daily.   Yes Historical Provider, MD  gabapentin (NEURONTIN) 400 MG capsule Take 1,200 mg by mouth daily.   Yes Historical Provider, MD  sertraline (ZOLOFT) 100 MG tablet Take 200 mg by mouth daily.   Yes Historical Provider, MD  naproxen (NAPROSYN) 500 MG tablet Take 1 tablet (500 mg total) by mouth 2 (two) times daily as needed for mild pain, moderate pain or headache (TAKE WITH MEALS.). 05/08/14   Rmani Kellogg Strupp Camprubi-Soms, PA-C  omeprazole (PRILOSEC) 20 MG capsule Take 1 capsule (20 mg total) by mouth daily. 05/08/14   Amandeep Nesmith Strupp Camprubi-Soms, PA-C  oxyCODONE-acetaminophen (PERCOCET) 5-325 MG per tablet Take 1-2 tablets by mouth every 6 (six) hours as needed for severe pain. 05/08/14   Hazle Ogburn Strupp Camprubi-Soms, PA-C   BP 131/97  Pulse 107  Temp(Src) 98 F (36.7 C) (Oral)  Resp 18  SpO2 96% Physical Exam  Nursing note and vitals reviewed. Constitutional: He is oriented to person, place, and time. Vital signs are normal. He appears well-developed and  well-nourished. No distress.  Afebrile, nontoxic, NAD  HENT:  Head: Normocephalic and atraumatic.  Mouth/Throat: Mucous membranes are normal.  Eyes: Conjunctivae and EOM are normal. Right eye exhibits no discharge. Left eye exhibits no discharge.  Neck: Normal range of motion. Neck supple.  Cardiovascular: Normal rate, regular rhythm, normal heart sounds and intact distal pulses.  Exam reveals no gallop and no friction rub.   No murmur heard. Pulmonary/Chest: Effort normal and breath sounds normal. No respiratory distress. He has no decreased breath sounds. He has no wheezes. He has no rhonchi. He has no rales.  CTAB in all lung fields  Abdominal: Soft. Normal appearance and bowel sounds are normal. He exhibits no distension. There is no tenderness. There is no rigidity, no rebound, no guarding, no CVA tenderness and no tenderness at McBurney's point.  Soft, NT/ND, no r/g/r, neg mcburney's point TTP, no CVA TTP. When asked, pt states he had some mild discomfort with palpation of R abdomen, but not very bothersome  Musculoskeletal: Normal range of motion.  Neurological: He is alert and oriented to person, place, and time.  Skin: Skin is warm, dry and intact. No rash noted.  Psychiatric: He has a normal mood and affect.    ED Course  Procedures (including critical care time) Labs Review  Labs Reviewed  CBC WITH DIFFERENTIAL - Abnormal; Notable for the following:    WBC 11.4 (*)    Hemoglobin 18.1 (*)    Neutro Abs 8.5 (*)    All other components within normal limits  COMPREHENSIVE METABOLIC PANEL  PRO B NATRIURETIC PEPTIDE  URINALYSIS, ROUTINE W REFLEX MICROSCOPIC    Imaging Review No results found.   EKG Interpretation None      MDM   Final diagnoses:  Abdominal pain, chronic, right lower quadrant  Anxiety state, unspecified  SOB (shortness of breath)    47y/o male with chronic abd pain and chronic SOB, neither is worse or different today. Completely benign abd exam and  lung exam. Currently not having many symptoms, and states overall he's improving. Discussed that imaging isn't indicated at this time given lack of change in symptoms. Labs demonstrate U/A WNL, WBC 11.4, CMP WNL, BNP neg. Doubt need for CXR given clear lung exam and no ongoing symptoms, doubt PE or other acute pulm abnormality. Doubt need for abd CT given improvement of symptoms overall. Given 1L bolus and 4mg  Morphine at pt request, with improvement of pain. Discussed having him F/up with GI for ongoing management, and get a PCP to eval his ongoing SOB. Discussed diet changes to help with diverticulosis, as well as decreasing ASA and coffee intake, using prilosec for GERD symptoms, and eating more fiber. I explained the diagnosis and have given explicit precautions to return to the ER including for any other new or worsening symptoms. The patient understands and accepts the medical plan as it's been dictated and I have answered their questions. Discharge instructions concerning home care and prescriptions have been given. The patient is STABLE and is discharged to home in good condition.  BP 143/99  Pulse 99  Temp(Src) 98 F (36.7 C) (Oral)  Resp 17  SpO2 99%  Meds ordered this encounter  Medications  . sodium chloride 0.9 % bolus 1,000 mL    Sig:   . morphine 4 MG/ML injection 4 mg    Sig:   . naproxen (NAPROSYN) 500 MG tablet    Sig: Take 1 tablet (500 mg total) by mouth 2 (two) times daily as needed for mild pain, moderate pain or headache (TAKE WITH MEALS.).    Dispense:  20 tablet    Refill:  0    Order Specific Question:  Supervising Provider    Answer:  Noemi Chapel D [[id number]]  . oxyCODONE-acetaminophen (PERCOCET) 5-325 MG per tablet    Sig: Take 1-2 tablets by mouth every 6 (six) hours as needed for severe pain.    Dispense:  10 tablet    Refill:  0    Order Specific Question:  Supervising Provider    Answer:  Noemi Chapel D [[id number]]  . omeprazole (PRILOSEC) 20 MG capsule    Sig:  Take 1 capsule (20 mg total) by mouth daily.    Dispense:  30 capsule    Refill:  0    Order Specific Question:  Supervising Provider    Answer:  OSHA, ERRICO [address]     [place], PA-C 05/08/14 1840

## 2014-05-08 NOTE — ED Notes (Signed)
Pt c/o abd pain x 6 months, pt has diverticulitis. Pt c/o SOB x 2 -3 years, pt states ha has asthma.

## 2014-05-11 NOTE — ED Provider Notes (Signed)
Medical screening examination/treatment/procedure(s) were performed by non-physician practitioner and as supervising physician I was immediately available for consultation/collaboration.   EKG Interpretation None        Houston Siren III, MD 05/11/14 1630

## 2014-11-13 ENCOUNTER — Emergency Department (HOSPITAL_COMMUNITY): Payer: Medicaid Other

## 2014-11-13 ENCOUNTER — Encounter (HOSPITAL_COMMUNITY): Payer: Self-pay | Admitting: Emergency Medicine

## 2014-11-13 ENCOUNTER — Emergency Department (HOSPITAL_COMMUNITY)
Admission: EM | Admit: 2014-11-13 | Discharge: 2014-11-13 | Disposition: A | Discharge status: Home / Self Care | Payer: Medicaid Other | Attending: Emergency Medicine | Admitting: Emergency Medicine

## 2014-11-13 DIAGNOSIS — Z79899 Other long term (current) drug therapy: Secondary | ICD-10-CM | POA: Insufficient documentation

## 2014-11-13 DIAGNOSIS — F419 Anxiety disorder, unspecified: Secondary | ICD-10-CM | POA: Insufficient documentation

## 2014-11-13 DIAGNOSIS — F329 Major depressive disorder, single episode, unspecified: Secondary | ICD-10-CM | POA: Insufficient documentation

## 2014-11-13 DIAGNOSIS — R1032 Left lower quadrant pain: Secondary | ICD-10-CM | POA: Diagnosis not present

## 2014-11-13 DIAGNOSIS — R1030 Lower abdominal pain, unspecified: Secondary | ICD-10-CM | POA: Insufficient documentation

## 2014-11-13 DIAGNOSIS — R109 Unspecified abdominal pain: Secondary | ICD-10-CM

## 2014-11-13 DIAGNOSIS — Z8719 Personal history of other diseases of the digestive system: Secondary | ICD-10-CM | POA: Insufficient documentation

## 2014-11-13 DIAGNOSIS — Z9049 Acquired absence of other specified parts of digestive tract: Secondary | ICD-10-CM | POA: Diagnosis not present

## 2014-11-13 DIAGNOSIS — Z7982 Long term (current) use of aspirin: Secondary | ICD-10-CM | POA: Diagnosis not present

## 2014-11-13 DIAGNOSIS — Z880 Allergy status to penicillin: Secondary | ICD-10-CM | POA: Diagnosis not present

## 2014-11-13 HISTORY — DX: Diverticulitis of intestine, part unspecified, without perforation or abscess without bleeding: K57.92

## 2014-11-13 LAB — CBC WITH DIFFERENTIAL/PLATELET
BASOS PCT: 0 % (ref 0–1)
Basophils Absolute: 0 10*3/uL (ref 0.0–0.1)
EOS ABS: 0.1 10*3/uL (ref 0.0–0.7)
EOS PCT: 1 % (ref 0–5)
HEMATOCRIT: 52.6 % — AB (ref 39.0–52.0)
Hemoglobin: 18.3 g/dL — ABNORMAL HIGH (ref 13.0–17.0)
LYMPHS PCT: 16 % (ref 12–46)
Lymphs Abs: 2.1 10*3/uL (ref 0.7–4.0)
MCH: 31.3 pg (ref 26.0–34.0)
MCHC: 34.8 g/dL (ref 30.0–36.0)
MCV: 89.9 fL (ref 78.0–100.0)
MONO ABS: 0.8 10*3/uL (ref 0.1–1.0)
Monocytes Relative: 6 % (ref 3–12)
Neutro Abs: 10.2 10*3/uL — ABNORMAL HIGH (ref 1.7–7.7)
Neutrophils Relative %: 77 % (ref 43–77)
PLATELETS: 136 10*3/uL — AB (ref 150–400)
RBC: 5.85 MIL/uL — ABNORMAL HIGH (ref 4.22–5.81)
RDW: 14 % (ref 11.5–15.5)
WBC: 13.2 10*3/uL — AB (ref 4.0–10.5)

## 2014-11-13 LAB — COMPREHENSIVE METABOLIC PANEL
ALBUMIN: 4 g/dL (ref 3.5–5.2)
ALK PHOS: 83 U/L (ref 39–117)
ALT: 16 U/L (ref 0–53)
AST: 23 U/L (ref 0–37)
Anion gap: 9 (ref 5–15)
BILIRUBIN TOTAL: 0.6 mg/dL (ref 0.3–1.2)
BUN: 10 mg/dL (ref 6–23)
CHLORIDE: 104 mmol/L (ref 96–112)
CO2: 24 mmol/L (ref 19–32)
Calcium: 8.7 mg/dL (ref 8.4–10.5)
Creatinine, Ser: 1.2 mg/dL (ref 0.50–1.35)
GFR calc Af Amer: 80 mL/min — ABNORMAL LOW (ref >90)
GFR calc non Af Amer: 69 mL/min — ABNORMAL LOW (ref >90)
Glucose, Bld: 98 mg/dL (ref 70–99)
POTASSIUM: 4.4 mmol/L (ref 3.5–5.1)
SODIUM: 137 mmol/L (ref 135–145)
TOTAL PROTEIN: 7.5 g/dL (ref 6.0–8.3)

## 2014-11-13 LAB — URINALYSIS, ROUTINE W REFLEX MICROSCOPIC
GLUCOSE, UA: NEGATIVE mg/dL
Hgb urine dipstick: NEGATIVE
Ketones, ur: NEGATIVE mg/dL
LEUKOCYTES UA: NEGATIVE
Nitrite: NEGATIVE
PROTEIN: NEGATIVE mg/dL
SPECIFIC GRAVITY, URINE: 1.029 (ref 1.005–1.030)
Urobilinogen, UA: 1 mg/dL (ref 0.0–1.0)
pH: 5.5 (ref 5.0–8.0)

## 2014-11-13 LAB — I-STAT CG4 LACTIC ACID, ED
LACTIC ACID, VENOUS: 0.53 mmol/L (ref 0.5–2.0)
Lactic Acid, Venous: 2.22 mmol/L (ref 0.5–2.0)

## 2014-11-13 LAB — LIPASE, BLOOD: Lipase: 39 U/L (ref 11–59)

## 2014-11-13 MED ORDER — DICYCLOMINE HCL 20 MG PO TABS: 20.0000 mg | ORAL_TABLET | Freq: Two times a day (BID) | ORAL | Status: DC

## 2014-11-13 MED ORDER — IOHEXOL 300 MG/ML  SOLN
100.0000 mL | Freq: Once | INTRAMUSCULAR | Status: AC | PRN
  Administered 2014-11-13: 100 mL via INTRAVENOUS

## 2014-11-13 MED ORDER — OXYCODONE-ACETAMINOPHEN 5-325 MG PO TABS
2.0000 | ORAL_TABLET | Freq: Once | ORAL | Status: AC
  Administered 2014-11-13: 2 via ORAL
  Filled 2014-11-13: qty 2

## 2014-11-13 MED ORDER — FENTANYL CITRATE 0.05 MG/ML IJ SOLN
50.0000 ug | Freq: Once | INTRAMUSCULAR | Status: AC
  Administered 2014-11-13: 50 ug via INTRAVENOUS
  Filled 2014-11-13: qty 2

## 2014-11-13 NOTE — ED Provider Notes (Signed)
CSN: [id number]     Arrival date & time 11/13/14  1707 History   First MD Initiated Contact with Patient 11/13/14 1808     Chief Complaint  Patient presents with  . Abdominal Pain     (Consider location/radiation/quality/duration/timing/severity/associated sxs/prior Treatment) HPI Comments: The patient is a 49 year old male, he reports having a history of diverticulitis approximately one year ago, he has also had a cholecystectomy in the past. He presents to the hospital with a complaint of abdominal discomfort, he states that it is all over, it is not focal, it is associated with frequent loose stools ever since he was diagnosed last year with diverticulitis. He states that his symptoms never really went away, he has pain everyday, he is also mixed with times when he is totally pain-free. It seems to gradually be getting worse every day. It is not associated with bowel movements, urination or eating. He denies blood in the stools or dysuria. He denies abdominal distention or nausea  Patient is a 49 y.o. male presenting with abdominal pain. The history is provided by the patient.  Abdominal Pain   Past Medical History  Diagnosis Date  . Depression   . Anxiety   . Diverticulitis    Past Surgical History  Procedure Laterality Date  . Cholecystectomy     History reviewed. No pertinent family history. History  Substance Use Topics  . Smoking status: Never Smoker   . Smokeless tobacco: Not on file  . Alcohol Use: No    Review of Systems  Gastrointestinal: Positive for abdominal pain.  All other systems reviewed and are negative.     Allergies  Penicillins  Home Medications   Prior to Admission medications   Medication Sig Start Date End Date Taking? Authorizing Provider  albuterol (PROVENTIL HFA;VENTOLIN HFA) 108 (90 BASE) MCG/ACT inhaler Inhale 2 puffs into the lungs every 6 (six) hours as needed for wheezing or shortness of breath.   Yes Historical Provider, MD  aspirin  325 MG tablet Take 1,625 mg by mouth 2 (two) times daily.   Yes Historical Provider, MD  clonazePAM (KLONOPIN) 1 MG tablet Take 1 mg by mouth 2 (two) times daily.    Yes Historical Provider, MD  Fluticasone-Salmeterol (ADVAIR) 250-50 MCG/DOSE AEPB Inhale 1 puff into the lungs daily.    Yes Historical Provider, MD  gabapentin (NEURONTIN) 600 MG tablet Take 600 mg by mouth 3 (three) times daily.   Yes Historical Provider, MD  omeprazole (PRILOSEC) 20 MG capsule Take 1 capsule (20 mg total) by mouth daily. 05/08/14  Yes Mercedes Camprubi-Soms, PA-C  sertraline (ZOLOFT) 100 MG tablet Take 200 mg by mouth daily.   Yes Historical Provider, MD  dicyclomine (BENTYL) 20 MG tablet Take 1 tablet (20 mg total) by mouth 2 (two) times daily. 11/13/14   Noemi Chapel, MD  naproxen (NAPROSYN) 500 MG tablet Take 1 tablet (500 mg total) by mouth 2 (two) times daily as needed for mild pain, moderate pain or headache (TAKE WITH MEALS.). Patient not taking: Reported on 11/13/2014 05/08/14   Mercedes Camprubi-Soms, PA-C  oxyCODONE-acetaminophen (PERCOCET) 5-325 MG per tablet Take 1-2 tablets by mouth every 6 (six) hours as needed for severe pain. Patient not taking: Reported on 11/13/2014 05/08/14   Mercedes Camprubi-Soms, PA-C   BP 138/76 mmHg  Pulse 93  Temp(Src) 98.2 F (36.8 C) (Oral)  Resp 18  SpO2 96% Physical Exam  Constitutional: He appears well-developed and well-nourished. No distress.  HENT:  Head: Normocephalic and atraumatic.  Mouth/Throat:  Oropharynx is clear and moist. No oropharyngeal exudate.  Eyes: Conjunctivae and EOM are normal. Pupils are equal, round, and reactive to light. Right eye exhibits no discharge. Left eye exhibits no discharge. No scleral icterus.  Neck: Normal range of motion. Neck supple. No JVD present. No thyromegaly present.  Cardiovascular: Normal rate, regular rhythm, normal heart sounds and intact distal pulses.  Exam reveals no gallop and no friction rub.   No murmur  heard. Pulmonary/Chest: Effort normal and breath sounds normal. No respiratory distress. He has no wheezes. He has no rales.  Abdominal: Soft. Bowel sounds are normal. He exhibits no distension and no mass. There is no tenderness.  Minimal abdominal tenderness in the left lower quadrant, suprapubic area and mid abdomen, no guarding, no masses, no peritoneal signs, no Murphy sign, no pain in McBurney's point  Musculoskeletal: Normal range of motion. He exhibits no edema or tenderness.  Lymphadenopathy:    He has no cervical adenopathy.  Neurological: He is alert. Coordination normal.  Skin: Skin is warm and dry. No rash noted. No erythema.  Psychiatric: He has a normal mood and affect. His behavior is normal.  Nursing note and vitals reviewed.   ED Course  Procedures (including critical care time) Labs Review Labs Reviewed  COMPREHENSIVE METABOLIC PANEL - Abnormal; Notable for the following:    GFR calc non Af Amer 69 (*)    GFR calc Af Amer 80 (*)    All other components within normal limits  CBC WITH DIFFERENTIAL/PLATELET - Abnormal; Notable for the following:    WBC 13.2 (*)    RBC 5.85 (*)    Hemoglobin 18.3 (*)    HCT 52.6 (*)    Platelets 136 (*)    Neutro Abs 10.2 (*)    All other components within normal limits  URINALYSIS, ROUTINE W REFLEX MICROSCOPIC - Abnormal; Notable for the following:    Color, Urine AMBER (*)    Bilirubin Urine SMALL (*)    All other components within normal limits  I-STAT CG4 LACTIC ACID, ED - Abnormal; Notable for the following:    Lactic Acid, Venous 2.22 (*)    All other components within normal limits  LIPASE, BLOOD  I-STAT CG4 LACTIC ACID, ED    Imaging Review Ct Abdomen Pelvis W Contrast  11/13/2014   CLINICAL DATA:  Acute abdominal pain.  Pain increasing over 1 day.  EXAM: CT ABDOMEN AND PELVIS WITH CONTRAST  TECHNIQUE: Multidetector CT imaging of the abdomen and pelvis was performed using the standard protocol following bolus  administration of intravenous contrast.  CONTRAST:  144mL OMNIPAQUE IOHEXOL 300 MG/ML  SOLN  COMPARISON:  CT abdomen/ pelvis 03/19/2011  FINDINGS: Minimal atelectasis in the lingula. The included lung bases are otherwise clear.  Clips in the gallbladder fossa from cholecystectomy. The liver demonstrates diffusely decreased density consistent with hepatic steatosis, no focal hepatic lesion. There is no biliary dilatation. The spleen, pancreas, and adrenal glands are normal. Kidneys demonstrate symmetric enhancement and excretion. Small subcentimeter hypodensities within the left kidney, too small to characterize, likely simple cysts.  The stomach is physiologically distended. There are no dilated or thickened bowel loops. Diverticulosis of the descending and sigmoid colon, no diverticulitis. The appendix is normal.  The abdominal aorta is normal in caliber. There is no retroperitoneal adenopathy. No free air, free fluid, or intra-abdominal fluid collection.  The urinary bladder is physiologically distended. Prostate gland is normal in size. No pelvic free fluid or adenopathy.  There is mild degenerative  change in the spine, no acute or suspicious osseous abnormality.  IMPRESSION: 1. No acute abnormality in the abdomen/pelvis. 2. Diverticulosis without diverticulitis. 3. Hepatic steatosis.  Postcholecystectomy.   Electronically Signed   By: Jeb Levering M.D.   On: 11/13/2014 21:56      MDM   Final diagnoses:  Abdominal pain, acute  Abdominal pain, acute    The patient's exam is very unremarkable, could be diverticulitis though the pattern suggests that it is more chronic pain and irritable bowel syndrome given his daily pain which is intermittent, nonsurgical abdomen and relatively normal vital signs.  CT reviewed - no acute findings - VS normal - labs wnl, pt better, Rx bentyl, possible IBS  Meds given in ED:  Medications  oxyCODONE-acetaminophen (PERCOCET/ROXICET) 5-325 MG per tablet 2 tablet  (2 tablets Oral Given 11/13/14 1838)  fentaNYL (SUBLIMAZE) injection 50 mcg (50 mcg Intravenous Given 11/13/14 2009)  iohexol (OMNIPAQUE) 300 MG/ML solution 100 mL (100 mLs Intravenous Contrast Given 11/13/14 2132)    New Prescriptions   DICYCLOMINE (BENTYL) 20 MG TABLET    Take 1 tablet (20 mg total) by mouth 2 (two) times daily.        Noemi Chapel, MD 11/13/14 [phone]

## 2014-11-13 NOTE — ED Notes (Signed)
Pt reports he was diagnosed with diverticulits a year ago and has had abd pain ever since. Pt reports pain got worse last night. No v/d. Pt feels bloated.

## 2014-11-13 NOTE — Discharge Instructions (Signed)
Please call your doctor for a followup appointment within 24-48 hours. When you talk to your doctor please let them know that you were seen in the emergency department and have them acquire all of your records so that they can discuss the findings with you and formulate a treatment plan to fully care for your new and ongoing problems. ° °Abdominal Pain °Many things can cause abdominal pain. Usually, abdominal pain is not caused by a disease and will improve without treatment. It can often be observed and treated at home. Your health care provider will do a physical exam and possibly order blood tests and X-rays to help determine the seriousness of your pain. However, in many cases, more time must pass before a clear cause of the pain can be found. Before that point, your health care provider may not know if you need more testing or further treatment. °HOME CARE INSTRUCTIONS  °Monitor your abdominal pain for any changes. The following actions may help to alleviate any discomfort you are experiencing: °· Only take over-the-counter or prescription medicines as directed by your health care provider. °· Do not take laxatives unless directed to do so by your health care provider. °· Try a clear liquid diet (broth, tea, or water) as directed by your health care provider. Slowly move to a bland diet as tolerated. °SEEK MEDICAL CARE IF: °· You have unexplained abdominal pain. °· You have abdominal pain associated with nausea or diarrhea. °· You have pain when you urinate or have a bowel movement. °· You experience abdominal pain that wakes you in the night. °· You have abdominal pain that is worsened or improved by eating food. °· You have abdominal pain that is worsened with eating fatty foods. °· You have a fever. °SEEK IMMEDIATE MEDICAL CARE IF:  °· Your pain does not go away within 2 hours. °· You keep throwing up (vomiting). °· Your pain is felt only in portions of the abdomen, such as the right side or the left lower  portion of the abdomen. °· You pass bloody or black tarry stools. °MAKE SURE YOU: °· Understand these instructions.   °· Will watch your condition.   °· Will get help right away if you are not doing well or get worse.   °Document Released: 05/20/2005 Document Revised: 08/15/2013 Document Reviewed: 04/19/2013 °Exit[employer]® Patient Information ©2015 [employer]. This information is not intended to replace advice given to you by your health care provider. Make sure you discuss any questions you have with your health care provider. ° °

## 2015-01-25 ENCOUNTER — Ambulatory Visit (INDEPENDENT_AMBULATORY_CARE_PROVIDER_SITE_OTHER): Payer: Medicaid Other | Admitting: Family Medicine

## 2015-01-25 ENCOUNTER — Encounter: Payer: Self-pay | Admitting: Family Medicine

## 2015-01-25 VITALS — BP 163/105 | HR 107 | Temp 98.4°F | Ht 70.0 in | Wt 262.0 lb

## 2015-01-25 DIAGNOSIS — Z720 Tobacco use: Secondary | ICD-10-CM

## 2015-01-25 DIAGNOSIS — R1013 Epigastric pain: Secondary | ICD-10-CM

## 2015-01-25 DIAGNOSIS — R140 Abdominal distension (gaseous): Secondary | ICD-10-CM

## 2015-01-25 DIAGNOSIS — F332 Major depressive disorder, recurrent severe without psychotic features: Secondary | ICD-10-CM

## 2015-01-25 DIAGNOSIS — J45909 Unspecified asthma, uncomplicated: Secondary | ICD-10-CM | POA: Diagnosis present

## 2015-01-25 DIAGNOSIS — Z79899 Other long term (current) drug therapy: Secondary | ICD-10-CM | POA: Diagnosis not present

## 2015-01-25 DIAGNOSIS — Z0000 Encounter for general adult medical examination without abnormal findings: Secondary | ICD-10-CM | POA: Diagnosis not present

## 2015-01-25 DIAGNOSIS — M79602 Pain in left arm: Secondary | ICD-10-CM | POA: Diagnosis not present

## 2015-01-25 DIAGNOSIS — F411 Generalized anxiety disorder: Secondary | ICD-10-CM | POA: Diagnosis not present

## 2015-01-25 DIAGNOSIS — F17200 Nicotine dependence, unspecified, uncomplicated: Secondary | ICD-10-CM | POA: Insufficient documentation

## 2015-01-25 DIAGNOSIS — F329 Major depressive disorder, single episode, unspecified: Secondary | ICD-10-CM | POA: Insufficient documentation

## 2015-01-25 DIAGNOSIS — L723 Sebaceous cyst: Secondary | ICD-10-CM

## 2015-01-25 LAB — CBC
HCT: 51.8 % (ref 39.0–52.0)
HEMOGLOBIN: 18.4 g/dL — AB (ref 13.0–17.0)
MCH: 31.1 pg (ref 26.0–34.0)
MCHC: 35.5 g/dL (ref 30.0–36.0)
MCV: 87.5 fL (ref 78.0–100.0)
MPV: 10.1 fL (ref 8.6–12.4)
Platelets: 153 10*3/uL (ref 150–400)
RBC: 5.92 MIL/uL — ABNORMAL HIGH (ref 4.22–5.81)
RDW: 14.3 % (ref 11.5–15.5)
WBC: 9.6 10*3/uL (ref 4.0–10.5)

## 2015-01-25 LAB — POCT H PYLORI SCREEN: H Pylori Screen, POC: NEGATIVE

## 2015-01-25 LAB — POCT GLYCOSYLATED HEMOGLOBIN (HGB A1C): Hemoglobin A1C: 5.5

## 2015-01-25 MED ORDER — ALBUTEROL SULFATE HFA 108 (90 BASE) MCG/ACT IN AERS: 2.0000 | INHALATION_SPRAY | Freq: Four times a day (QID) | RESPIRATORY_TRACT | Status: DC | PRN

## 2015-01-25 MED ORDER — FLUTICASONE-SALMETEROL 250-50 MCG/DOSE IN AEPB: 1.0000 | INHALATION_SPRAY | Freq: Every day | RESPIRATORY_TRACT | Status: DC

## 2015-01-25 MED ORDER — DICYCLOMINE HCL 20 MG PO TABS: 20.0000 mg | ORAL_TABLET | Freq: Three times a day (TID) | ORAL | Status: DC

## 2015-01-25 NOTE — Assessment & Plan Note (Signed)
Encouraged cessation.

## 2015-01-25 NOTE — Assessment & Plan Note (Signed)
Located under left eyelid. Will refer to dermatology for excision.

## 2015-01-25 NOTE — Progress Notes (Signed)
Bryan Collins is a 49 y.o. male who presents to the [facility] today to establish care. His concerns today include:  HPI:  Cyst Located just under left eyelid. Has been present for 1 year. Has a similar lesion on his face that he self-excised. Is mildly pruritic, but has not had any redness or drainage. No treatments tried.   Left arm pain Last year patient was diagnosed with biceps tendonitis in his left arm. Received medications, which resolved his symptoms for 6-7 months, but has now returned. States that it feels "like swelling." No new exercises. No trauma. Pain radiates into palms and thumb. Described as sharp and electrical.   Asthma. Chronic per patient. Has been seen in the ED 4 or 5 times, but never hospitalized. Reports having PFTs last month. Has been taking advair and albuterol for several years. Triggered by heat and humidity. Has been using albuterol 2-3 times per week.   Abdominal pain and bloating Patient reports that symptoms started last year. Started with severe abdominal cramps. He had a CT performed and was diagnosed with diverticulitis. Was given a course of antibiotics. Now has persistent bloating and abdominal pain. States that stools are always soft, and never well formed. No watery or liquid diarrhea. No hematochezia. States that stools are foul smelling.  Pain is intermittent and not associated with eating. Has had some nausea, but no vomiting. Was diagnosed with IBS and started on dicyclomine, which has helped with his bloating.   Smoking Trying to quit. Currently using nicotine patches.   ROS: As per HPI, otherwise all systems reviewed and are negative.  Past Medical History - Reviewed and updated Patient Active Problem List   Diagnosis Date Noted  . Asthma, chronic 01/25/2015  . MDD (major depressive disorder) 01/25/2015  . GAD (generalized anxiety disorder) 01/25/2015  . Tobacco use disorder 01/25/2015  . Sebaceous cyst 01/25/2015  . Left arm pain  01/25/2015  . Abdominal bloating 01/25/2015  . Healthcare maintenance 01/25/2015    Medications- reviewed and updated Current Outpatient Prescriptions  Medication Sig Dispense Refill  . albuterol (PROVENTIL HFA;VENTOLIN HFA) 108 (90 BASE) MCG/ACT inhaler Inhale 2 puffs into the lungs every 6 (six) hours as needed for wheezing or shortness of breath. 18 g 2  . Brexpiprazole 0.5 MG TABS Take by mouth.    . clonazePAM (KLONOPIN) 1 MG tablet Take 1 mg by mouth 2 (two) times daily.     Marland Kitchen dicyclomine (BENTYL) 20 MG tablet Take 1 tablet (20 mg total) by mouth 4 (four) times daily -  before meals and at bedtime. 120 tablet 2  . Fluticasone-Salmeterol (ADVAIR) 250-50 MCG/DOSE AEPB Inhale 1 puff into the lungs daily. 60 each 1  . gabapentin (NEURONTIN) 600 MG tablet Take 400 mg by mouth 3 (three) times daily.     Marland Kitchen omeprazole (PRILOSEC) 20 MG capsule Take 1 capsule (20 mg total) by mouth daily. 30 capsule 0  . sertraline (ZOLOFT) 100 MG tablet Take 200 mg by mouth daily.     No current facility-administered medications for this visit.    Objective: Physical Exam: BP 163/105 mmHg  Pulse 107  Temp(Src) 98.4 F (36.9 C) (Oral)  Ht 5\' 10"  (1.778 m)  Wt 262 lb (118.842 kg)  BMI 37.59 kg/m2  Gen: NAD, resting comfortably CV: RRR with no murmurs appreciated Lungs: NWOB, CTAB with no crackles, wheezes, or rhonchi Abdomen: Normal bowel sounds present. Soft, Nontender, Nondistended. Ext: no edema Skin: warm, dry Neuro: grossly normal, moves all extremities  Results for orders placed or performed in visit on 01/25/15 (from the past 72 hour(s))  POCT glycosylated hemoglobin (Hb A1C)     Status: None   Collection Time: 01/25/15  4:02 PM  Result Value Ref Range   Hemoglobin A1C 5.5   H.pylori screen, POC     Status: None   Collection Time: 01/25/15  4:02 PM  Result Value Ref Range   H Pylori Screen, POC NEG     A/P: See problem list  Sebaceous cyst Located under left eyelid. Will refer to  dermatology for excision.    Asthma, chronic Refilled albuterol and advair today. Will obtain PFTs.    Left arm pain Unclear etiology. No concerning findings on exam. Possibly tendonitis. Will better evaluate at next appointment. Would likely benefit from short course of NSAIDs.    Abdominal bloating Unclear etiology. POC h pylori testing negative. Would consider Celiac's disease, chronic pancreatitis, or malabsorption. Refilled dicyclomine today. Consider work up for above if not improving by next appointment.    Tobacco use disorder Encouraged cessation.    Healthcare maintenance Will check lipids and A1c today. Will need colonoscopy.      Orders Placed This Encounter  Procedures  . CBC  . Basic metabolic panel  . TSH  . Lipid Panel  . Ambulatory referral to Dermatology    Referral Priority:  Routine    Referral Type:  Consultation    Referral Reason:  Specialty Services Required    Requested Specialty:  Dermatology    Number of Visits Requested:  1  . POCT glycosylated hemoglobin (Hb A1C)  . H.pylori screen, POC    Meds ordered this encounter  Medications  . Brexpiprazole 0.5 MG TABS    Sig: Take by mouth.  . dicyclomine (BENTYL) 20 MG tablet    Sig: Take 1 tablet (20 mg total) by mouth 4 (four) times daily -  before meals and at bedtime.    Dispense:  120 tablet    Refill:  2  . albuterol (PROVENTIL HFA;VENTOLIN HFA) 108 (90 BASE) MCG/ACT inhaler    Sig: Inhale 2 puffs into the lungs every 6 (six) hours as needed for wheezing or shortness of breath.    Dispense:  18 g    Refill:  2  . Fluticasone-Salmeterol (ADVAIR) 250-50 MCG/DOSE AEPB    Sig: Inhale 1 puff into the lungs daily.    Dispense:  60 each    Refill:  [place]. Jerline Pain, [place] Resident PGY-1 01/25/2015 5:32 PM

## 2015-01-25 NOTE — Assessment & Plan Note (Signed)
Will check lipids and A1c today. Will need colonoscopy.

## 2015-01-25 NOTE — Assessment & Plan Note (Signed)
Unclear etiology. POC h pylori testing negative. Would consider Celiac's disease, chronic pancreatitis, or malabsorption. Refilled dicyclomine today. Consider work up for above if not improving by next appointment.

## 2015-01-25 NOTE — Patient Instructions (Signed)
Thank you for coming to the clinic today. It was nice seeing you.  For your cyst, we sent in a referral to dermatology.  For your left arm pain, this is probably inflammation. The best treatment for this would be NSAIDs, but I would like to determine if you have an infection that can cause stomach ulcers before treating with NSAIDs.  For your asthma, we will refill your albuterol and advair today. Please schedule an appointment for PFTs at the front desk.  For your stomach problems, we will be testing for H pylori today. We also refilled your dicyclomine today. If your symptoms are not improving, please let us know.  We will be checking your cholesterol and blood sugar today.  We will see you again in 3-6 months or sooner if you need anything else. Please make sure you get your PFTs before our next visit.

## 2015-01-25 NOTE — Assessment & Plan Note (Signed)
Unclear etiology. No concerning findings on exam. Possibly tendonitis. Will better evaluate at next appointment. Would likely benefit from short course of NSAIDs.

## 2015-01-25 NOTE — Assessment & Plan Note (Signed)
Refilled albuterol and advair today. Will obtain PFTs.

## 2015-01-26 LAB — LIPID PANEL
CHOL/HDL RATIO: 6.7 ratio
Cholesterol: 195 mg/dL (ref 0–200)
HDL: 29 mg/dL — ABNORMAL LOW (ref >=40)
LDL CALC: 121 mg/dL — AB (ref 0–99)
TRIGLYCERIDES: 225 mg/dL — AB (ref <150)
VLDL: 45 mg/dL — ABNORMAL HIGH (ref 0–40)

## 2015-01-26 LAB — BASIC METABOLIC PANEL
BUN: 8 mg/dL (ref 6–23)
CO2: 27 meq/L (ref 19–32)
Calcium: 9.1 mg/dL (ref 8.4–10.5)
Chloride: 101 mEq/L (ref 96–112)
Creat: 1.08 mg/dL (ref 0.50–1.35)
Glucose, Bld: 84 mg/dL (ref 70–99)
Potassium: 4.4 mEq/L (ref 3.5–5.3)
Sodium: 137 mEq/L (ref 135–145)

## 2015-01-26 LAB — TSH: TSH: 2.055 u[IU]/mL (ref 0.350–4.500)

## 2015-01-26 NOTE — Progress Notes (Signed)
One of the assigned preceptor. 

## 2015-01-28 ENCOUNTER — Encounter: Payer: Self-pay | Admitting: Family Medicine

## 2015-01-28 NOTE — Progress Notes (Signed)
Results reviewed. Would benefit from starting statin. Will discuss at follow up appointment.  Algis Greenhouse. Jerline Pain, [place] Resident PGY-1 01/28/2015 12:28 PM

## 2015-03-23 ENCOUNTER — Other Ambulatory Visit: Payer: Self-pay | Admitting: Family Medicine

## 2015-04-23 ENCOUNTER — Other Ambulatory Visit: Payer: Self-pay | Admitting: Family Medicine

## 2015-05-23 ENCOUNTER — Other Ambulatory Visit: Payer: Self-pay | Admitting: Family Medicine

## 2015-05-23 NOTE — Telephone Encounter (Signed)
Rx filled.  Algis Greenhouse. Jerline Pain, [place] [facility] Resident PGY-2 05/23/2015 2:28 PM

## 2015-07-22 ENCOUNTER — Encounter: Payer: Self-pay | Admitting: Family Medicine

## 2015-07-22 ENCOUNTER — Ambulatory Visit (INDEPENDENT_AMBULATORY_CARE_PROVIDER_SITE_OTHER): Payer: Medicaid Other | Admitting: Family Medicine

## 2015-07-22 VITALS — BP 166/102 | HR 104 | Temp 98.1°F | Wt 264.6 lb

## 2015-07-22 DIAGNOSIS — F339 Major depressive disorder, recurrent, unspecified: Secondary | ICD-10-CM

## 2015-07-22 DIAGNOSIS — K589 Irritable bowel syndrome without diarrhea: Secondary | ICD-10-CM

## 2015-07-22 DIAGNOSIS — Z23 Encounter for immunization: Secondary | ICD-10-CM | POA: Diagnosis not present

## 2015-07-22 MED ORDER — CLONAZEPAM 0.5 MG PO TABS: 0.5000 mg | ORAL_TABLET | Freq: Four times a day (QID) | ORAL | Status: DC | PRN

## 2015-07-22 MED ORDER — DICYCLOMINE HCL 20 MG PO TABS: 20.0000 mg | ORAL_TABLET | Freq: Three times a day (TID) | ORAL | Status: DC

## 2015-07-22 MED ORDER — BREXPIPRAZOLE 2 MG PO TABS: 2.0000 mg | ORAL_TABLET | Freq: Every day | ORAL | Status: DC

## 2015-07-22 MED ORDER — FLUTICASONE-SALMETEROL 250-50 MCG/DOSE IN AEPB: INHALATION_SPRAY | RESPIRATORY_TRACT | Status: DC

## 2015-07-22 MED ORDER — SERTRALINE HCL 100 MG PO TABS: 200.0000 mg | ORAL_TABLET | Freq: Every day | ORAL | Status: DC

## 2015-07-22 NOTE — Patient Instructions (Addendum)
Thank you for coming to the clinic today. It was nice seeing you.  We will refill your depression medications today. I think it would be a good idea for you to see our psychologist Dr Gwenlyn Saran. Please give her a call as soon as you can.  Please come back to see me in 3-6 months, or sooner if you need anything else.  If your depression continues to get worse, please let us know.   Take care,  Dr Jerline Pain

## 2015-07-22 NOTE — Progress Notes (Signed)
    Subjective:  Bryan Collins is a 49 y.o. male who presents to the [facility] today with a chief complaint of depression and anxiety.   HPI:  Depression / GAD  Patient with history of depression starting in 2000. Was initially diagnosed by his PCP and started on paxil. Was tried on several medications until trying zoloft which helped a lot. Was stable for several years before worsening. Patient was then seen at [facility] where he was started on [place]. Has done well until the past few weeks when has been feeling a bit more down. Reports that his anxiety symptoms are what is bothering him the most. Patient's brother also has a history of depression. He denies any current SI or HI. Patient has no other psych history. He was hospitalized in 2009 for depression. Patient had a cousin that committed suicide.   PHQ-9 score of 10 today. GAD-7 score of 20.  Abdominal Pain Patient presented with bloating and cramps 5 months ago. He was previously diagnosed with IBS and started on dicyclomine which has significantly helped his symptoms. No fevers or chills. No bloody stools. No weight loss.   ROS: Per HPI  Objective:  Physical Exam: BP 166/102 mmHg  Pulse 104  Temp(Src) 98.1 F (36.7 C) (Oral)  Wt 264 lb 9.6 oz (120.022 kg)  Gen: NAD, resting comfortably CV: RRR with no murmurs appreciated Lungs: NWOB, CTAB with no crackles, wheezes, or rhonchi GI: Normal bowel sounds present. Soft, Nontender, Nondistended. MSK: no edema, cyanosis, or clubbing noted Skin: warm, dry Neuro: grossly normal, moves all extremities Psych: Normal affect and thought content, No SI or HI  Assessment/Plan:  MDD (major depressive disorder) Patient with mixed depression and anxiety symptoms. PHQ-9 score of 10 today. GAD-7 score of 20. Will refill his Zoloft and rexulti today. Also refilled clonazepam. Patient noted to have significant anxiety component today. Patient is looking for new mental health provider. Gave  him contact information for Dr Gwenlyn Saran. Hopefully he will be able to get in to the mood disorder clinic soon. Instructed patient to schedule a follow up appointment with me if he was not able to get into the mood disorder clinic. Would likely benefit from addition of buspar given anxiety component. Will discuss at follow up if patient not already seen by another mental health provider.   IBS (irritable bowel syndrome) Improving. Will continue bentyl.     Algis Greenhouse. Jerline Pain, [place] [facility] Resident PGY-2 07/22/2015 4:48 PM

## 2015-07-22 NOTE — Assessment & Plan Note (Signed)
Patient with mixed depression and anxiety symptoms. PHQ-9 score of 10 today. GAD-7 score of 20. Will refill his Zoloft and rexulti today. Also refilled clonazepam. Patient noted to have significant anxiety component today. Patient is looking for new mental health provider. Gave him contact information for Dr Gwenlyn Saran. Hopefully he will be able to get in to the mood disorder clinic soon. Instructed patient to schedule a follow up appointment with me if he was not able to get into the mood disorder clinic. Would likely benefit from addition of buspar given anxiety component. Will discuss at follow up if patient not already seen by another mental health provider.

## 2015-07-22 NOTE — Assessment & Plan Note (Signed)
Improving. Will continue bentyl.

## 2015-07-23 ENCOUNTER — Other Ambulatory Visit: Payer: Self-pay | Admitting: Family Medicine

## 2015-08-29 ENCOUNTER — Other Ambulatory Visit: Payer: Self-pay | Admitting: *Deleted

## 2015-08-30 MED ORDER — CLONAZEPAM 0.5 MG PO TABS: 0.5000 mg | ORAL_TABLET | Freq: Four times a day (QID) | ORAL | Status: DC | PRN

## 2015-08-30 NOTE — Telephone Encounter (Signed)
Rx filled and left at front of office.  Algis Greenhouse. Jerline Pain, [place] Resident PGY-2 08/30/2015 12:19 PM

## 2015-08-30 NOTE — Telephone Encounter (Signed)
pts mother informed that Rx was ready for pick up Fleeger, Salome Spotted

## 2015-10-01 ENCOUNTER — Other Ambulatory Visit: Payer: Self-pay | Admitting: *Deleted

## 2015-10-03 MED ORDER — CLONAZEPAM 0.5 MG PO TABS: 0.5000 mg | ORAL_TABLET | Freq: Four times a day (QID) | ORAL | Status: DC | PRN

## 2015-10-03 NOTE — Telephone Encounter (Signed)
LM for patient that script is ready for pick up. Jacquelynn Friend,CMA  

## 2015-10-03 NOTE — Telephone Encounter (Signed)
Rx filled and left at front of office.  Algis Greenhouse. Jerline Pain, [place] Resident PGY-2 10/03/2015 1:32 PM

## 2015-10-25 ENCOUNTER — Other Ambulatory Visit: Payer: Self-pay | Admitting: Family Medicine

## 2015-10-25 NOTE — Telephone Encounter (Signed)
Rx filled.  Algis Greenhouse. Jerline Pain, [place] [facility] Resident PGY-2 10/25/2015 5:33 PM

## 2015-11-11 ENCOUNTER — Other Ambulatory Visit: Payer: Self-pay | Admitting: *Deleted

## 2015-11-11 MED ORDER — CLONAZEPAM 0.5 MG PO TABS: 0.5000 mg | ORAL_TABLET | Freq: Four times a day (QID) | ORAL | Status: DC | PRN

## 2015-11-11 NOTE — Telephone Encounter (Signed)
Rx printed and left at front of office.  Algis Greenhouse. Jerline Pain, [place] [facility] Resident PGY-2 11/11/2015 4:22 PM

## 2015-11-11 NOTE — Telephone Encounter (Signed)
Pt informed. Fleeger, Jessica Dawn, CMA  

## 2015-11-13 ENCOUNTER — Other Ambulatory Visit: Payer: Self-pay | Admitting: Family Medicine

## 2015-11-13 ENCOUNTER — Telehealth: Payer: Self-pay | Admitting: *Deleted

## 2015-11-13 MED ORDER — SERTRALINE HCL 100 MG PO TABS: 200.0000 mg | ORAL_TABLET | Freq: Every day | ORAL | Status: DC

## 2015-11-13 NOTE — Telephone Encounter (Signed)
[facility] Pharmacist called needing a new prescription for Zoloft.  The patient requires brand name only and per medicaid it need to stated Brand name medically necessary or Dispense as written.  Call [facility] with questions at [phone].  Please advise.  Derl Barrow, RN

## 2015-11-13 NOTE — Telephone Encounter (Signed)
New Rx sent in.  Algis Greenhouse. Jerline Pain, [facility] Resident PGY-2 11/13/2015 5:58 PM

## 2015-11-26 ENCOUNTER — Other Ambulatory Visit: Payer: Self-pay | Admitting: Family Medicine

## 2015-12-13 ENCOUNTER — Encounter: Payer: Self-pay | Admitting: Family Medicine

## 2015-12-13 ENCOUNTER — Ambulatory Visit (INDEPENDENT_AMBULATORY_CARE_PROVIDER_SITE_OTHER): Payer: Medicaid Other | Admitting: Family Medicine

## 2015-12-13 VITALS — BP 140/90 | HR 113 | Temp 97.8°F | Ht 70.0 in | Wt 263.0 lb

## 2015-12-13 DIAGNOSIS — R1013 Epigastric pain: Secondary | ICD-10-CM | POA: Diagnosis not present

## 2015-12-13 DIAGNOSIS — F339 Major depressive disorder, recurrent, unspecified: Secondary | ICD-10-CM | POA: Diagnosis not present

## 2015-12-13 DIAGNOSIS — Z1211 Encounter for screening for malignant neoplasm of colon: Secondary | ICD-10-CM | POA: Diagnosis not present

## 2015-12-13 DIAGNOSIS — F411 Generalized anxiety disorder: Secondary | ICD-10-CM

## 2015-12-13 MED ORDER — CLONAZEPAM 0.5 MG PO TABS: 0.5000 mg | ORAL_TABLET | Freq: Four times a day (QID) | ORAL | Status: DC | PRN

## 2015-12-13 MED ORDER — SERTRALINE HCL 100 MG PO TABS: 200.0000 mg | ORAL_TABLET | Freq: Every day | ORAL | Status: DC

## 2015-12-13 MED ORDER — GABAPENTIN 600 MG PO TABS: 300.0000 mg | ORAL_TABLET | Freq: Three times a day (TID) | ORAL | Status: DC

## 2015-12-13 MED ORDER — GI COCKTAIL ~~LOC~~: 30.0000 mL | Freq: Once | ORAL | Status: DC

## 2015-12-13 MED ORDER — BREXPIPRAZOLE 2 MG PO TABS: 2.0000 mg | ORAL_TABLET | Freq: Every day | ORAL | Status: DC

## 2015-12-13 NOTE — Addendum Note (Signed)
Addended by: Vivi Barrack on: 12/13/2015 03:39 PM   Modules accepted: Orders

## 2015-12-13 NOTE — Progress Notes (Signed)
   Subjective:  Bryan Collins is a 50 y.o. male who presents to the [facility] today with a chief complaint of abdominal pain.   HPI:  Abdominal Pain / Bloating / IBS Patient with previously diagnosed IBS. Has been on bentyl in the past which has helped, though has noticed worsening symptoms over the past several weeks. Symptoms have been present for about a year. Pain is now located in his epigastric area and worse after eating. Some occasional nausea without vomiting. No diarrhea or constipation. No weight loss, fevers, or chills. Had an H pylori test last June which was negative. Currently takes 4 aspirin daily, no other NSAIDs. Occasionally takes omeprazole which helps with the pain. Wants to see a gastroenterologist for his colonoscopy and also to discuss this abdominal pain.   GAD/MDD Stable on zoloft, rexulti, klonopin, and gabapentin. Was previously managed by psychiatry, and then his former PCP. Has not established with another mental health specialist in the area yet. PHQ-9 score of 0 and GAD-7 score of 0 today.   ROS: Per HPI  Objective:  Physical Exam: BP 140/90 mmHg  Pulse 113  Temp(Src) 97.8 F (36.6 C) (Oral)  Ht 5\' 10"  (1.778 m)  Wt 263 lb (119.296 kg)  BMI 37.74 kg/m2  SpO2 93%  Gen: NAD, resting comfortably CV: RRR with no murmurs appreciated Pulm: NWOB, CTAB with no crackles, wheezes, or rhonchi GI: Normal bowel sounds present. No tenderness on palpation. No masses. No murphy's sign.  MSK: no edema, cyanosis, or clubbing noted Skin: warm, dry Neuro: grossly normal, moves all extremities Psych: Normal affect and thought content, no SI or HI  Assessment/Plan:  Epigastric pain Concern for PUD disease given location and the fact that it improves after eating and the fact that the patient has been taking 4 aspirins a day. Pain improved somewhat with GI cocktail today. Instructed patient to start back PPI and avoid NSAIDs. Previously had h pylori and lipase tests which were  unremarkable. Doubt gallbladder pathology, though can consider RUQ ultrasound if not improving with PPI. Patient will be referred to GI for colonoscopy, may consider EGD to confirm PUD.   MDD (major depressive disorder) Symptoms much improved today. Will continue current medications. PHQ-9 score of 0.   GAD (generalized anxiety disorder) Symptoms also much improved today. GAD-7 score of 0. Will continue current medications. Encouraged patient to find mental health specialist in the area.   Bryan Collins. Jerline Pain, [place] Resident PGY-2 12/13/2015 3:37 PM

## 2015-12-13 NOTE — Assessment & Plan Note (Signed)
Concern for PUD disease given location and the fact that it improves after eating and the fact that the patient has been taking 4 aspirins a day. Pain improved somewhat with GI cocktail today. Instructed patient to start back PPI and avoid NSAIDs. Previously had h pylori and lipase tests which were unremarkable. Doubt gallbladder pathology, though can consider RUQ ultrasound if not improving with PPI. Patient will be referred to GI for colonoscopy, may consider EGD to confirm PUD.

## 2015-12-13 NOTE — Assessment & Plan Note (Signed)
Symptoms much improved today. Will continue current medications. PHQ-9 score of 0.

## 2015-12-13 NOTE — Assessment & Plan Note (Signed)
Symptoms also much improved today. GAD-7 score of 0. Will continue current medications. Encouraged patient to find mental health specialist in the area.

## 2015-12-13 NOTE — Patient Instructions (Signed)
We will refill your medications today.  I put in a referral to GI  We will see you again in 2-3 months. We can check blood work then.  Take care,  Dr Jerline Pain

## 2015-12-16 ENCOUNTER — Encounter: Payer: Self-pay | Admitting: Gastroenterology

## 2015-12-18 ENCOUNTER — Encounter: Payer: Self-pay | Admitting: Gastroenterology

## 2015-12-18 ENCOUNTER — Ambulatory Visit (INDEPENDENT_AMBULATORY_CARE_PROVIDER_SITE_OTHER): Payer: Medicaid Other | Admitting: Gastroenterology

## 2015-12-18 VITALS — BP 160/100 | HR 90 | Ht 70.0 in | Wt 263.8 lb

## 2015-12-18 DIAGNOSIS — R1084 Generalized abdominal pain: Secondary | ICD-10-CM

## 2015-12-18 DIAGNOSIS — Z1211 Encounter for screening for malignant neoplasm of colon: Secondary | ICD-10-CM | POA: Diagnosis not present

## 2015-12-18 MED ORDER — NA SULFATE-K SULFATE-MG SULF 17.5-3.13-1.6 GM/177ML PO SOLN: 1.0000 | Freq: Once | ORAL | Status: DC

## 2015-12-18 NOTE — Progress Notes (Signed)
JABULANI FLEXER    [id number]    08/30/1965  Primary Care Physician:Caleb Jerline Pain, MD  Referring Physician: Vivi Barrack, MD [address], [place] [address]  Chief complaint:  Abdominal pain   HPI: 50 year old male here for new patient evaluation with complaints of diffuse crampy abdominal pain worsening for past 5-6 months, he gets intermittent episodes about once or twice a day . He has some associated nausea but no vomiting. Denies any dysphagia or odynophagia. He had an episode of diverticulitis 2 years ago that required antibiotics but was not admitted to the hospital. Denies any change in bowel movements, no melena or blood per rectum. He has intermittent heartburn about once a week and takes over-the-counter omeprazole. He takes 4-5. Full dose aspirin daily and has recently cut back previously used to take 10 aspirin, BC or Goody's powder . His identical twin brother had 7 polyps removed from his colon when he had his screening colonoscopy.   Outpatient Encounter Prescriptions as of 12/18/2015  Medication Sig  . ADVAIR DISKUS 250-50 MCG/DOSE AEPB TAKE 1 PUFF BY MOUTH EVERY DAY  . albuterol (PROVENTIL HFA;VENTOLIN HFA) 108 (90 BASE) MCG/ACT inhaler Inhale 2 puffs into the lungs every 6 (six) hours as needed for wheezing or shortness of breath.  . Brexpiprazole (REXULTI) 2 MG TABS Take 2 mg by mouth daily.  . clonazePAM (KLONOPIN) 0.5 MG tablet Take 1 tablet (0.5 mg total) by mouth every 6 (six) hours as needed for anxiety.  . dicyclomine (BENTYL) 20 MG tablet TAKE 1 TABLET BY MOUTH 4 TIMES A DAY BEFORE MEALS AND AT BEDTIME  . gabapentin (NEURONTIN) 600 MG tablet Take 0.5 tablets (300 mg total) by mouth 3 (three) times daily.  Marland Kitchen omeprazole (PRILOSEC) 20 MG capsule Take 1 capsule (20 mg total) by mouth daily.  . sertraline (ZOLOFT) 100 MG tablet Take 2 tablets (200 mg total) by mouth daily.   No facility-administered encounter medications on file as of  12/18/2015.    Allergies as of 12/18/2015 - Review Complete 12/13/2015  Allergen Reaction Noted  . Penicillins Hives 05/08/2014    Past Medical History  Diagnosis Date  . Depression   . Anxiety   . Diverticulitis     Past Surgical History  Procedure Laterality Date  . Cholecystectomy      History reviewed. No pertinent family history.  Social History   Social History  . Marital Status: Single    Spouse Name: N/A  . Number of Children: N/A  . Years of Education: N/A   Occupational History  . Not on file.   Social History Main Topics  . Smoking status: Current Every Day Smoker -- 2.00 packs/day for 30 years    Types: Cigars, Cigarettes    Start date: 01/22/1985  . Smokeless tobacco: Never Used  . Alcohol Use: No  . Drug Use: Yes     Comment: recreational marijuana use  . Sexual Activity: Not Currently   Other Topics Concern  . Not on file   Social History Narrative      Review of systems: Review of Systems  Constitutional: Negative for fever and chills.  HENT: Negative.   Eyes: Negative for blurred vision.  Respiratory: Negative for cough, shortness of breath and wheezing.   Cardiovascular: Negative for chest pain and palpitations.  Gastrointestinal: as per HPI Genitourinary: Negative for dysuria, urgency, frequency and hematuria.  Musculoskeletal: Negative for myalgias, back pain and joint pain.  Skin: Negative for itching and rash.  Neurological: Negative for dizziness, tremors, focal weakness, seizures and loss of consciousness.  Endo/Heme/Allergies: Negative for environmental allergies.  Psychiatric/Behavioral: Negative for depression, suicidal ideas and hallucinations.  All other systems reviewed and are negative.   Physical Exam: Filed Vitals:   12/18/15 1450  BP: 160/100  Pulse: 90   Gen:      No acute distress HEENT:  EOMI, sclera anicteric Neck:     No masses; no thyromegaly Lungs:    Clear to auscultation bilaterally; normal  respiratory effort CV:         Regular rate and rhythm; no murmurs Abd:      + bowel sounds; soft, non-tender; no palpable masses, no distension Ext:    No edema; adequate peripheral perfusion Skin:      Warm and dry; no rash Neuro: alert and oriented x 3 Psych: normal mood and affect  Data Reviewed:Reviewed chart in epic   Assessment and Plan/Recommendations:   50 year old male here for new patient evaluation with complaints of diffuse crampy abdominal pain likely could be related to diverticulosis, irritable bowel syndrome But given history of significant NSAID's use, he couldn't have ulcers/erosions in the gastrointestinal tract He is due for screening colonoscopy and also given history of diverticulitis,  will need colonoscopy to evaluate. We'll also schedule for EGD at the same time to evaluate for possible peptic ulcer disease Advise patient to stop taking NSAID's Continue omeprazole daily Return after the procedure as needed  K. Denzil Magnuson , MD [phone] Mon-Fri 8a-5p [phone] after 5p, weekends, holidays

## 2015-12-18 NOTE — Patient Instructions (Addendum)
You have been scheduled for a colonoscopy/Colonoscopy Please follow written instructions given to you at your visit today.  Please pick up your prep supplies at the pharmacy within the next 1-3 days. If you use inhalers (even only as needed), please bring them with you on the day of your procedure. Your physician has requested that you go to www.[email or web address].[email or web address] and enter the access code given to you at your visit today. This web site gives a general overview about your procedure. However, you should still follow specific instructions given to you by our office regarding your preparation for the procedure.

## 2015-12-31 ENCOUNTER — Encounter: Payer: Medicaid Other | Admitting: Gastroenterology

## 2015-12-31 ENCOUNTER — Ambulatory Visit (AMBULATORY_SURGERY_CENTER): Payer: Medicaid Other | Admitting: Gastroenterology

## 2015-12-31 ENCOUNTER — Encounter: Payer: Self-pay | Admitting: Gastroenterology

## 2015-12-31 VITALS — BP 139/89 | HR 106 | Temp 98.9°F | Resp 22 | Ht 70.0 in | Wt 263.0 lb

## 2015-12-31 DIAGNOSIS — R12 Heartburn: Secondary | ICD-10-CM

## 2015-12-31 DIAGNOSIS — Z1211 Encounter for screening for malignant neoplasm of colon: Secondary | ICD-10-CM | POA: Diagnosis not present

## 2015-12-31 DIAGNOSIS — R1084 Generalized abdominal pain: Secondary | ICD-10-CM | POA: Diagnosis not present

## 2015-12-31 DIAGNOSIS — K635 Polyp of colon: Secondary | ICD-10-CM | POA: Diagnosis not present

## 2015-12-31 DIAGNOSIS — D120 Benign neoplasm of cecum: Secondary | ICD-10-CM

## 2015-12-31 MED ORDER — OMEPRAZOLE 40 MG PO CPDR: 40.0000 mg | DELAYED_RELEASE_CAPSULE | Freq: Two times a day (BID) | ORAL | Status: DC

## 2015-12-31 MED ORDER — SODIUM CHLORIDE 0.9 % IV SOLN: 500.0000 mL | INTRAVENOUS | Status: DC

## 2015-12-31 NOTE — Progress Notes (Signed)
  [facility] Post-op Note  Patient: Bryan Collins  Procedure(s) Performed: colonoscopy and endoscopy  Patient Location: [facility] - Recovery Area  Anesthesia Type: Deep Sedation/Propofol  Level of Consciousness: awake, oriented and patient cooperative  Airway and Oxygen Therapy: Patient Spontanous Breathing  Post-op Pain: none  Post-op Assessment:  Post-op Vital signs reviewed, Patient's Cardiovascular Status Stable, Respiratory Function Stable, Patent Airway, No signs of Nausea or vomiting and Pain level controlled  Post-op Vital Signs: Reviewed and stable  Complications: No apparent anesthesia complications  [name] 3:36 PM

## 2015-12-31 NOTE — Op Note (Signed)
[facility] Patient Name: Bryan Collins Procedure Date: 12/31/2015 2:37 PM MRN: [id number] Endoscopist: Mauri Pole , MD Age: 50 Date of Birth: Apr 15, 1966 Gender: Male Procedure:                Upper GI endoscopy Indications:              Dyspepsia, Heartburn Medicines:                Monitored Anesthesia Care Procedure:                Pre-Anesthesia Assessment:                           - Prior to the procedure, a History and Physical                            was performed, and patient medications and                            allergies were reviewed. The patient's tolerance of                            previous anesthesia was also reviewed. The risks                            and benefits of the procedure and the sedation                            options and risks were discussed with the patient.                            All questions were answered, and informed consent                            was obtained. Prior Anticoagulants: The patient has                            taken no previous anticoagulant or antiplatelet                            agents. ASA Grade Assessment: II - A patient with                            mild systemic disease. After reviewing the risks                            and benefits, the patient was deemed in                            satisfactory condition to undergo the procedure.                           - Prior to the procedure, a History and Physical  was performed, and patient medications and                            allergies were reviewed. The patient's tolerance of                            previous anesthesia was also reviewed. The risks                            and benefits of the procedure and the sedation                            options and risks were discussed with the patient.                            All questions were answered, and informed consent   was obtained. Prior Anticoagulants: The patient has                            taken no previous anticoagulant or antiplatelet                            agents. ASA Grade Assessment: II - A patient with                            mild systemic disease. After reviewing the risks                            and benefits, the patient was deemed in                            satisfactory condition to undergo the procedure.                           After obtaining informed consent, the endoscope was                            passed under direct vision. Throughout the                            procedure, the patient's blood pressure, pulse, and                            oxygen saturations were monitored continuously. The                            Model GIF-HQ190 [id number]) scope was introduced                            through the mouth, and advanced to the second part                            of duodenum. The upper GI endoscopy was  accomplished without difficulty. The patient                            tolerated the procedure well. Scope In: Scope Out: Findings:                 The esophagus and gastroesophageal junction were                            examined with white light and narrow band imaging                            (NBI). There were esophageal mucosal changes                            consistent with long-segment Barrett's esophagus.                            These changes involved the mucosa at the upper                            extent of the gastric folds (40 cm from the                            incisors) extending to the Z-line (32 cm from the                            incisors). The maximum longitudinal extent of these                            esophageal mucosal changes was 8 cm in length.                            Mucosa was biopsied with a cold forceps for                            histology. A total of 5 specimen bottles were  sent                            to pathology.                           Diffuse moderately erythematous mucosa without                            bleeding was found in the entire examined stomach.                            Biopsies were taken with a cold forceps for                            Helicobacter pylori testing.                           The first portion  of the duodenum and second                            portion of the duodenum were normal. Complications:            No immediate complications. Estimated Blood Loss:     Estimated blood loss was minimal. Impression:               - Esophageal mucosal changes consistent with                            long-segment Barrett's esophagus. Biopsied.                           - Erythematous mucosa in the stomach.                           - Normal first portion of the duodenum and second                            portion of the duodenum. Recommendation:           - Patient has a contact number available for                            emergencies. The signs and symptoms of potential                            delayed complications were discussed with the                            patient. Return to normal activities tomorrow.                            Written discharge instructions were provided to the                            patient.                           - Resume previous diet.                           - Continue present medications.                           - Await pathology results.                           - Repeat upper endoscopy in 1 year for surveillance.                           - Return to GI clinic in 3 months. Mauri Pole, MD 12/31/2015 3:45:25 PM This report has been signed electronically.

## 2015-12-31 NOTE — Progress Notes (Signed)
Called to room to assist during endoscopic procedure.  Patient ID and intended procedure confirmed with present staff. Received instructions for my participation in the procedure from the performing physician.  

## 2015-12-31 NOTE — Op Note (Signed)
[facility] Patient Name: Bryan Collins Procedure Date: 12/31/2015 2:37 PM MRN: [id number] Endoscopist: Mauri Pole , MD Age: 50 Date of Birth: 03/20/1966 Gender: Male Procedure:                Colonoscopy Indications:              Screening for colorectal malignant neoplasm Medicines:                Monitored Anesthesia Care Procedure:                Pre-Anesthesia Assessment:                           - Prior to the procedure, a History and Physical                            was performed, and patient medications and                            allergies were reviewed. The patient's tolerance of                            previous anesthesia was also reviewed. The risks                            and benefits of the procedure and the sedation                            options and risks were discussed with the patient.                            All questions were answered, and informed consent                            was obtained. Prior Anticoagulants: The patient has                            taken no previous anticoagulant or antiplatelet                            agents. ASA Grade Assessment: II - A patient with                            mild systemic disease. After reviewing the risks                            and benefits, the patient was deemed in                            satisfactory condition to undergo the procedure.                           After obtaining informed consent, the colonoscope  was passed under direct vision. Throughout the                            procedure, the patient's blood pressure, pulse, and                            oxygen saturations were monitored continuously. The                            Model CF-HQ190L [id number]) scope was introduced                            through the anus and advanced to the the cecum,                            identified by appendiceal orifice and ileocecal                       valve. The colonoscopy was performed without                            difficulty. The patient tolerated the procedure                            well. The quality of the bowel preparation was                            good. The ileocecal valve, appendiceal orifice, and                            rectum were photographed. Scope In: 3:06:01 PM Scope Out: 3:27:42 PM Scope Withdrawal Time: 0 hours 10 minutes 40 seconds  Total Procedure Duration: 0 hours 21 minutes 41 seconds  Findings:                 The perianal and digital rectal examinations were                            normal.                           A 15 mm polyp was found in the cecum. The polyp was                            sessile. The polyp was removed with a cold snare.                            Resection and retrieval were complete.                           Multiple small-mouthed diverticula were found in                            the sigmoid colon.  Non-bleeding internal hemorrhoids were found during                            retroflexion. The hemorrhoids were small. Complications:            No immediate complications. Estimated Blood Loss:     Estimated blood loss was minimal. Impression:               - One 15 mm polyp in the cecum, removed with a cold                            snare. Resected and retrieved.                           - Diverticulosis in the sigmoid colon.                           - Non-bleeding internal hemorrhoids. Recommendation:           - Patient has a contact number available for                            emergencies. The signs and symptoms of potential                            delayed complications were discussed with the                            patient. Return to normal activities tomorrow.                            Written discharge instructions were provided to the                            patient.                           -  Resume previous diet.                           - Continue present medications.                           - Await pathology results.                           - Repeat colonoscopy in 3 years for surveillance.                           - Return to GI clinic in 3 months. Mauri Pole, MD 12/31/2015 3:34:48 PM This report has been signed electronically.

## 2015-12-31 NOTE — Patient Instructions (Signed)

## 2016-01-01 ENCOUNTER — Telehealth: Payer: Self-pay | Admitting: *Deleted

## 2016-01-01 NOTE — Telephone Encounter (Signed)
  Follow up Call-  Call back number 12/31/2015  Post procedure Call Back phone  # [phone]  Permission to leave phone message Yes     Patient questions:  Do you have a fever, pain , or abdominal swelling? No. Pain Score  0 *  Have you tolerated food without any problems? Yes.    Have you been able to return to your normal activities? Yes.    Do you have any questions about your discharge instructions: Diet   No. Medications  No. Follow up visit  No.  Do you have questions or concerns about your Care? No.  Actions: * If pain score is 4 or above: No action needed, pain <4.

## 2016-01-06 ENCOUNTER — Encounter: Payer: Self-pay | Admitting: Gastroenterology

## 2016-01-10 ENCOUNTER — Telehealth: Payer: Self-pay | Admitting: Gastroenterology

## 2016-01-10 NOTE — Telephone Encounter (Signed)
Off and on pain center and to the right below the sternum. No burping or belching. No radiating. Pain occurs mostly at night but sometimes during the day. He can not recreate the pain with exertion. Takes Prilosec 40 mg BID. Has not taken any Bentyl in a month. He will try the Bentyl. Call back in a few days with a progress report. Go to an urgent care or the ED if he acutely worsens.

## 2016-01-10 NOTE — Telephone Encounter (Signed)
Continue PPI twice daily before breakfast and dinner. He can take additional H2 blocker (Zantac 300mg ) at bedtime as needed for nocturnal symptoms

## 2016-01-13 NOTE — Telephone Encounter (Signed)
I have left a message for the patient to call back  

## 2016-01-14 NOTE — Telephone Encounter (Signed)
Patient returned phone call. Best # [phone]

## 2016-01-14 NOTE — Telephone Encounter (Signed)
I have left a message for the patient to call back with an update of his symptoms

## 2016-01-15 NOTE — Telephone Encounter (Signed)
Patient has had positive results with the Bentyl. He states he feels much better.  He will call if he has any further issues.

## 2016-02-04 ENCOUNTER — Other Ambulatory Visit: Payer: Self-pay | Admitting: *Deleted

## 2016-02-05 MED ORDER — FLUTICASONE-SALMETEROL 250-50 MCG/DOSE IN AEPB: INHALATION_SPRAY | RESPIRATORY_TRACT | Status: DC

## 2016-02-05 NOTE — Telephone Encounter (Signed)
Rx filled.  Algis Greenhouse. Jerline Pain, [facility] Resident PGY-2 02/05/2016 8:42 AM

## 2016-02-12 ENCOUNTER — Other Ambulatory Visit: Payer: Self-pay | Admitting: *Deleted

## 2016-02-12 MED ORDER — DICYCLOMINE HCL 20 MG PO TABS: ORAL_TABLET | ORAL | Status: DC

## 2016-02-14 ENCOUNTER — Other Ambulatory Visit: Payer: Self-pay | Admitting: *Deleted

## 2016-02-17 MED ORDER — CLONAZEPAM 0.5 MG PO TABS: 0.5000 mg | ORAL_TABLET | Freq: Four times a day (QID) | ORAL | Status: DC | PRN

## 2016-02-17 NOTE — Telephone Encounter (Signed)
Rx printed and left at front of office.  Algis Greenhouse. Jerline Pain, [facility] Resident PGY-2 02/17/2016 10:45 AM

## 2016-02-17 NOTE — Telephone Encounter (Signed)
LM for patient that script is ready for pick up. Dovie Kapusta,CMA  

## 2016-03-07 ENCOUNTER — Other Ambulatory Visit: Payer: Self-pay | Admitting: Family Medicine

## 2016-03-09 NOTE — Telephone Encounter (Signed)
Rx filled.  Algis Greenhouse. Jerline Pain, [place] Resident PGY-3 03/09/2016 9:14 AM

## 2016-03-23 ENCOUNTER — Encounter: Payer: Self-pay | Admitting: Family Medicine

## 2016-03-23 ENCOUNTER — Ambulatory Visit (INDEPENDENT_AMBULATORY_CARE_PROVIDER_SITE_OTHER): Payer: Medicaid Other | Admitting: Family Medicine

## 2016-03-23 DIAGNOSIS — F339 Major depressive disorder, recurrent, unspecified: Secondary | ICD-10-CM | POA: Diagnosis not present

## 2016-03-23 DIAGNOSIS — F411 Generalized anxiety disorder: Secondary | ICD-10-CM | POA: Diagnosis not present

## 2016-03-23 MED ORDER — CLONAZEPAM 0.5 MG PO TABS: 0.5000 mg | ORAL_TABLET | Freq: Four times a day (QID) | ORAL | 0 refills | Status: DC | PRN

## 2016-03-23 MED ORDER — BREXPIPRAZOLE 2 MG PO TABS: 2.0000 mg | ORAL_TABLET | Freq: Every day | ORAL | 3 refills | Status: DC

## 2016-03-23 MED ORDER — SERTRALINE HCL 100 MG PO TABS: 200.0000 mg | ORAL_TABLET | Freq: Every day | ORAL | 3 refills | Status: DC

## 2016-03-23 NOTE — Assessment & Plan Note (Signed)
Symptoms much worse today. GAD-7 score of 18. Will refer to psychiatry as noted above. Continue current medications in the meantime.

## 2016-03-23 NOTE — Progress Notes (Signed)
   Subjective:  Bryan Collins is a 50 y.o. male who presents to the [facility] today with a chief complaint of depression and anxiety.   HPI:  GAD/MDD Symptoms have significantly worsened over the past couple of weeks. No clear precipitating event. No major life changes or stressors. Occasionally has thoughts of being better of dead but no active SI or HI. PHQ-9 score of 22 today with symptoms making life very difficult. GAD-7 score of 18 today with symptoms making life somewhat difficult. Has not seen any other mental health providers in the area. Symptoms include anhedonia, depressed mood, insomnia, lack of energy, and difficulty concentrating. Symptoms also include excessive worry, anxiety, and difficulty relaxing.   ROS: Per HPI  Objective:  Physical Exam: BP (!) 142/90 (BP Location: Left Arm, Patient Position: Sitting, Cuff Size: Large)   Pulse (!) 116   Temp 98.3 F (36.8 C) (Oral)   Ht 5\' 10"  (1.778 m)   Wt 268 lb (121.6 kg)   SpO2 98%   BMI 38.45 kg/m   Gen: NAD, resting comfortably Pulm: NWOB Skin: warm, dry  Neuro: grossly normal, moves all extremities Psych: Blunted affect. Depressed mood. Normal thought content. No SI or HI. No AVH.   Assessment/Plan:  MDD (major depressive disorder) Symptoms much worse. PHQ9 score of 22 today. No SI or HI. Patient already on antipsychotic and max dose of zoloft. Discussed meeting with psychologist vs integrative care clinic while here. Patient wished to be referred to psychiatry. Patient given a list of psychiatrists in the area and was instructed to call for appointment. Told patient to follow up with me in 1-2 weeks if he was not able to be seen by psychiatrist. Patient agreeable to this plan. Will continue current medications in the meantime.   GAD (generalized anxiety disorder) Symptoms much worse today. GAD-7 score of 18. Will refer to psychiatry as noted above. Continue current medications in the meantime.    Algis Greenhouse. Jerline Pain, [place] Resident PGY-3 03/23/2016 4:15 PM

## 2016-03-23 NOTE — Assessment & Plan Note (Addendum)
Symptoms much worse. PHQ9 score of 22 today. No SI or HI. Patient already on antipsychotic and max dose of zoloft. Discussed meeting with psychologist vs integrative care clinic while here. Patient wished to be referred to psychiatry. Patient given a list of psychiatrists in the area and was instructed to call for appointment. Told patient to follow up with me in 1-2 weeks if he was not able to be seen by psychiatrist. Patient agreeable to this plan. Will continue current medications in the meantime.

## 2016-03-23 NOTE — Patient Instructions (Signed)
I am sorry that your symptoms are getting worse. I do not have many more options for adjusting your medications.  Please schedule an appointment with me within the next couple of weeks that is not on a Monday afternoon. Please give Dr Gwenlyn Saran a call to set up an appointment.  We will continue your medications for now. If your symptoms are not getting better you may need a referral to a psychiatrist.  Take care,  Dr Jerline Pain

## 2016-04-16 ENCOUNTER — Telehealth: Payer: Self-pay | Admitting: *Deleted

## 2016-04-16 MED ORDER — GABAPENTIN 300 MG PO CAPS: 300.0000 mg | ORAL_CAPSULE | Freq: Three times a day (TID) | ORAL | 5 refills | Status: DC

## 2016-04-16 NOTE — Telephone Encounter (Signed)
Prior Authorization received from [facility] for gabapentin tablet. Formulary preferred per Medicaid are gabapentin capsule.  Please send new rx for the capsule.  Derl Barrow, RN

## 2016-04-16 NOTE — Telephone Encounter (Signed)
Formulary alternative sent in.  Algis Greenhouse. Jerline Pain, [place] Resident PGY-3 04/16/2016 10:51 AM

## 2016-04-30 ENCOUNTER — Other Ambulatory Visit: Payer: Self-pay | Admitting: *Deleted

## 2016-05-01 MED ORDER — CLONAZEPAM 0.5 MG PO TABS: 0.5000 mg | ORAL_TABLET | Freq: Four times a day (QID) | ORAL | 0 refills | Status: DC | PRN

## 2016-05-01 NOTE — Telephone Encounter (Signed)
Rx filled and left at front of office.  Algis Greenhouse. Jerline Pain, [place] Resident PGY-3 05/01/2016 1:50 PM

## 2016-05-01 NOTE — Telephone Encounter (Signed)
Lm for patient that script is ready for pick up. Jazmin Hartsell,CMA

## 2016-05-13 ENCOUNTER — Other Ambulatory Visit: Payer: Self-pay | Admitting: *Deleted

## 2016-05-15 MED ORDER — FLUTICASONE-SALMETEROL 250-50 MCG/DOSE IN AEPB: INHALATION_SPRAY | RESPIRATORY_TRACT | 1 refills | Status: DC

## 2016-05-15 NOTE — Telephone Encounter (Signed)
2nd request.  Everett Ricciardelli L, RN  

## 2016-06-15 ENCOUNTER — Other Ambulatory Visit: Payer: Self-pay | Admitting: *Deleted

## 2016-06-16 ENCOUNTER — Ambulatory Visit (INDEPENDENT_AMBULATORY_CARE_PROVIDER_SITE_OTHER): Payer: Medicaid Other | Admitting: Family Medicine

## 2016-06-16 ENCOUNTER — Encounter: Payer: Self-pay | Admitting: Family Medicine

## 2016-06-16 DIAGNOSIS — K589 Irritable bowel syndrome without diarrhea: Secondary | ICD-10-CM | POA: Diagnosis not present

## 2016-06-16 MED ORDER — CLONAZEPAM 0.5 MG PO TABS: 0.5000 mg | ORAL_TABLET | Freq: Four times a day (QID) | ORAL | 0 refills | Status: DC | PRN

## 2016-06-16 NOTE — Assessment & Plan Note (Signed)
Patient with prior diagnosis of IBS. He is not currently taking any medications for this which may explain his worsened symptoms. Will restart his bentyl today. Also discussed FODMAPS diet with patient and recommended peppermint oil. Discussed him possibly being seen at the [facility] if not improving. Given his concurrent flushing type symptoms, carcinoid syndrome is also on the differential. Consider obtaining 5-HIAA if continues to have such symptoms. Follow up in 1 week. May need a further work up for chronic diarrhea if symptoms persist.

## 2016-06-16 NOTE — Patient Instructions (Addendum)
Please restart the bentyl.  Please look into a FODMAP diet.  You can also try using peppermint oil for your symptoms.  If you are not improving, you can try scheduling an appointment at the [facility] [phone]  Come back in the next week or so to recheck your blood pressure.  Take care,  Dr Jerline Pain

## 2016-06-16 NOTE — Progress Notes (Signed)
    Subjective:  Bryan Collins is a 50 y.o. male who presents to the [facility] today with a chief complaint of abdominal bloating.   HPI:  Abdominal Bloating / Diarrhea Patient has a history of IBS. Over the last several weeks to months he has noticed a worsening of his symptoms including hot sweats after eating and while having a bowel movement. He has also noticed that his bowel movements have been increasingly foul smelling. Stool is non bloody and non greasy. He usually has loose stools. Occasionally has sharp cramp-like pains that last only for a few moments. He is no longer taking the bentyl and he recently stopped taking rexulti. No weight loss.   ROS: Per HPI  PMH: Smoking history reviewed.    Objective:  Physical Exam: BP (!) 180/90   Pulse (!) 110   Temp 98.3 F (36.8 C) (Oral)   Wt 262 lb (118.8 kg)   BMI 37.59 kg/m   Gen: NAD, resting comfortably CV: RRR with no murmurs appreciated Pulm: NWOB, CTAB with no crackles, wheezes, or rhonchi GI: Obese, Normal bowel sounds present. Soft, Nontender, Nondistended. MSK: no edema, cyanosis, or clubbing noted Skin: warm, dry Neuro: grossly normal, moves all extremities Psych: Normal affect and thought content  Assessment/Plan:  IBS (irritable bowel syndrome) Patient with prior diagnosis of IBS. He is not currently taking any medications for this which may explain his worsened symptoms. Will restart his bentyl today. Also discussed FODMAPS diet with patient and recommended peppermint oil. Discussed him possibly being seen at the [facility] if not improving. Given his concurrent flushing type symptoms, carcinoid syndrome is also on the differential. Consider obtaining 5-HIAA if continues to have such symptoms. Follow up in 1 week. May need a further work up for chronic diarrhea if symptoms persist.   Algis Greenhouse. Jerline Pain, [place] [facility] Resident PGY-3 06/16/2016 5:15 PM

## 2016-06-16 NOTE — Telephone Encounter (Signed)
2nd request.  Martin, Tamika L, RN  

## 2016-06-25 ENCOUNTER — Ambulatory Visit (INDEPENDENT_AMBULATORY_CARE_PROVIDER_SITE_OTHER): Payer: Medicaid Other | Admitting: Family Medicine

## 2016-06-25 ENCOUNTER — Encounter: Payer: Self-pay | Admitting: Family Medicine

## 2016-06-25 VITALS — BP 148/88 | HR 110 | Temp 98.1°F | Wt 268.0 lb

## 2016-06-25 DIAGNOSIS — Z114 Encounter for screening for human immunodeficiency virus [HIV]: Secondary | ICD-10-CM

## 2016-06-25 DIAGNOSIS — I10 Essential (primary) hypertension: Secondary | ICD-10-CM | POA: Diagnosis not present

## 2016-06-25 DIAGNOSIS — K589 Irritable bowel syndrome without diarrhea: Secondary | ICD-10-CM | POA: Diagnosis not present

## 2016-06-25 DIAGNOSIS — Z1159 Encounter for screening for other viral diseases: Secondary | ICD-10-CM | POA: Diagnosis not present

## 2016-06-25 DIAGNOSIS — Z9189 Other specified personal risk factors, not elsewhere classified: Secondary | ICD-10-CM | POA: Diagnosis not present

## 2016-06-25 DIAGNOSIS — E669 Obesity, unspecified: Secondary | ICD-10-CM | POA: Diagnosis not present

## 2016-06-25 LAB — CBC
HEMATOCRIT: 53.1 % — AB (ref 38.5–50.0)
HEMOGLOBIN: 18.7 g/dL — AB (ref 13.2–17.1)
MCH: 31.1 pg (ref 27.0–33.0)
MCHC: 35.2 g/dL (ref 32.0–36.0)
MCV: 88.2 fL (ref 80.0–100.0)
MPV: 10.6 fL (ref 7.5–12.5)
Platelets: 139 10*3/uL — ABNORMAL LOW (ref 140–400)
RBC: 6.02 MIL/uL — AB (ref 4.20–5.80)
RDW: 14.3 % (ref 11.0–15.0)
WBC: 11.1 10*3/uL — AB (ref 3.8–10.8)

## 2016-06-25 LAB — POCT GLYCOSYLATED HEMOGLOBIN (HGB A1C): Hemoglobin A1C: 5.7

## 2016-06-25 MED ORDER — AMLODIPINE BESYLATE 5 MG PO TABS: 5.0000 mg | ORAL_TABLET | Freq: Every day | ORAL | 3 refills | Status: DC

## 2016-06-25 NOTE — Assessment & Plan Note (Signed)
Improving with resumption of bentyl. Will continue. Encouraged FODMAPS diet and peppermint oil. Return precautions reviewed. Follow up in 1 month. Carcinoid syndrome is still on the differential, though less likely. Consider 5-HIAA if continues to have diarrhea and flushing symptoms.

## 2016-06-25 NOTE — Assessment & Plan Note (Signed)
Patient with 3 consecutively elevated BPs. Will start norvac 5mg  today. Follow up in 1 month.

## 2016-06-25 NOTE — Patient Instructions (Addendum)
I am glad that you are feeling better. Please continue the bentyl. Please look into the FODMAPS diet and look into starting the peppermint oil.  We will start a blood pressure medication today. We want your BP to be less than 140/90.   We will check blood work today.   Please come back to have your tetanus shot. We should have it within the next 2 weeks. Please call before coming to make sure we have it in stock.  Please come back to see me in 1-3 months, or sooner if you need anything else.   Take care,  Dr Jerline Pain

## 2016-06-25 NOTE — Progress Notes (Signed)
   Subjective:  Bryan Collins is a 50 y.o. male who presents to the [facility] today with a chief complaint of IBS.   HPI:  IBS/Diarrhea Patient seen last week with worsening IBS symptoms including worsening diarrhea and bloating. Was also having some cramp-like pains and flushing episodes. Over the past week, symptoms have improved. Has not has a chance to look into FODMAPS. No side effect from bentyl.   Hypertension BP Readings from Last 3 Encounters:  06/25/16 (!) 148/88  06/16/16 (!) 180/90  03/23/16 (!) 142/90   Home BP monitoring-None Compliant with medications- Not currently on medications.  ROS-Denies any CP, HA, SOB, blurry vision, LE edema, transient weakness, orthopnea, PND.   ROS: Per HPI  PMH: Smoking history reviewed.    Objective:  Physical Exam: BP (!) 148/88   Pulse (!) 110   Temp 98.1 F (36.7 C) (Oral)   Wt 268 lb (121.6 kg)   SpO2 97%   BMI 38.45 kg/m   Gen: NAD, resting comfortably CV: RRR with no murmurs appreciated Pulm: NWOB, CTAB with no crackles, wheezes, or rhonchi GI: Normal bowel sounds present. Soft, Nontender, Nondistended. MSK: no edema, cyanosis, or clubbing noted Skin: warm, dry Neuro: grossly normal, moves all extremities Psych: Normal affect and thought content  Assessment/Plan:  Essential hypertension Patient with 3 consecutively elevated BPs. Will start norvac 5mg  today. Follow up in 1 month.   IBS (irritable bowel syndrome) Improving with resumption of bentyl. Will continue. Encouraged FODMAPS diet and peppermint oil. Return precautions reviewed. Follow up in 1 month. Carcinoid syndrome is still on the differential, though less likely. Consider 5-HIAA if continues to have diarrhea and flushing symptoms.   Algis Greenhouse. Jerline Pain, [place] Resident PGY-3 06/25/2016 3:36 PM

## 2016-06-26 ENCOUNTER — Encounter: Payer: Self-pay | Admitting: Family Medicine

## 2016-06-26 LAB — HEPATITIS C ANTIBODY: HCV Ab: NEGATIVE

## 2016-06-26 LAB — BASIC METABOLIC PANEL WITH GFR
BUN: 8 mg/dL (ref 7–25)
CO2: 23 mmol/L (ref 20–31)
Calcium: 9.1 mg/dL (ref 8.6–10.3)
Chloride: 103 mmol/L (ref 98–110)
Creat: 1.22 mg/dL (ref 0.70–1.33)
GFR, Est African American: 78 mL/min (ref >=60)
GFR, Est Non African American: 68 mL/min (ref >=60)
Glucose, Bld: 114 mg/dL — ABNORMAL HIGH (ref 65–99)
Potassium: 3.9 mmol/L (ref 3.5–5.3)
Sodium: 138 mmol/L (ref 135–146)

## 2016-06-26 LAB — LIPID PANEL
Cholesterol: 180 mg/dL (ref 125–200)
HDL: 29 mg/dL — ABNORMAL LOW (ref >=40)
LDL Cholesterol: 122 mg/dL (ref <130)
Total CHOL/HDL Ratio: 6.2 Ratio — ABNORMAL HIGH (ref <=5.0)
Triglycerides: 145 mg/dL (ref <150)
VLDL: 29 mg/dL (ref <30)

## 2016-06-26 LAB — HIV ANTIBODY (ROUTINE TESTING W REFLEX): HIV 1&2 Ab, 4th Generation: NONREACTIVE

## 2016-07-10 ENCOUNTER — Other Ambulatory Visit: Payer: Self-pay | Admitting: Family Medicine

## 2016-08-06 ENCOUNTER — Other Ambulatory Visit: Payer: Self-pay | Admitting: Family Medicine

## 2016-11-10 ENCOUNTER — Other Ambulatory Visit: Payer: Self-pay | Admitting: Family Medicine

## 2016-12-22 ENCOUNTER — Encounter: Payer: Self-pay | Admitting: Family Medicine

## 2016-12-22 ENCOUNTER — Ambulatory Visit (INDEPENDENT_AMBULATORY_CARE_PROVIDER_SITE_OTHER): Payer: Medicaid Other | Admitting: Family Medicine

## 2016-12-22 VITALS — BP 118/80 | HR 104 | Temp 98.0°F | Ht 70.0 in | Wt 284.0 lb

## 2016-12-22 DIAGNOSIS — F339 Major depressive disorder, recurrent, unspecified: Secondary | ICD-10-CM | POA: Diagnosis not present

## 2016-12-22 DIAGNOSIS — I10 Essential (primary) hypertension: Secondary | ICD-10-CM

## 2016-12-22 DIAGNOSIS — R109 Unspecified abdominal pain: Secondary | ICD-10-CM | POA: Diagnosis not present

## 2016-12-22 DIAGNOSIS — K589 Irritable bowel syndrome without diarrhea: Secondary | ICD-10-CM | POA: Diagnosis not present

## 2016-12-22 DIAGNOSIS — F411 Generalized anxiety disorder: Secondary | ICD-10-CM | POA: Diagnosis not present

## 2016-12-22 DIAGNOSIS — R197 Diarrhea, unspecified: Secondary | ICD-10-CM

## 2016-12-22 LAB — POCT H PYLORI SCREEN: H Pylori Screen, POC: NEGATIVE

## 2016-12-22 MED ORDER — CLONAZEPAM 0.5 MG PO TABS
0.5000 mg | ORAL_TABLET | Freq: Four times a day (QID) | ORAL | 0 refills | Status: DC | PRN
Start: 2016-12-22 — End: 2017-03-12

## 2016-12-22 MED ORDER — SERTRALINE HCL 100 MG PO TABS: 200.0000 mg | ORAL_TABLET | Freq: Every day | ORAL | 3 refills | Status: DC

## 2016-12-22 NOTE — Progress Notes (Signed)
   Subjective:  Bryan Collins is a 51 y.o. male who presents to the [facility] today with a chief complaint of IBS.   HPI:  IBS Chronic problem for patient with waxing and waning symptoms. He is still having some cramping Was last seen about 5 months ago for this. He has not been using bentyl because he thinks it does not help. He still occasionally has diarrhea and flushing episodes. Overall, he just feels "bad" after he eats. Has not looked into FODMAPS either. No fevers or chills.   Depression/Anxiety Patient currently on zoloft and klonopin which work well for him. He is changing psychiatrists and needs a refill until he can get into to see his new psychiatrist. No side effects. No SI or HI.   Hypertension BP Readings from Last 3 Encounters:  12/22/16 118/80  06/25/16 (!) 148/88  06/16/16 (!) 180/90   Home BP monitoring-Yes Compliant with medications-yes, without side effects ROS-Denies any CP, HA, SOB, blurry vision, LE edema, transient weakness, orthopnea, PND.    Objective:  Physical Exam: BP 118/80   Pulse (!) 104   Temp 98 F (36.7 C) (Oral)   Ht 5\' 10"  (1.778 m)   Wt 284 lb (128.8 kg)   SpO2 96%   BMI 40.75 kg/m   Gen: NAD, resting comfortably CV: RRR with no murmurs appreciated Pulm: NWOB, CTAB with no crackles, wheezes, or rhonchi GI: Normal bowel sounds present. Soft, Nontender, Nondistended. MSK: no edema, cyanosis, or clubbing noted Skin: warm, dry Neuro: grossly normal, moves all extremities Psych: Normal affect and thought content  Assessment/Plan:  IBS (irritable bowel syndrome) Patient not adherent to bentyl and has not looked into FODMAPS. H pylori tested again today per patient request and was negative. Encouraged regular use of bentyl. Will check 5-HIAA today to rule out carcinoid syndrome. If negative and patient continues to have symptoms, consider GI referral.   MDD (major depressive disorder) Symptoms stable today. No SI or HI. Zoloft refilled. List  of psychiatrists in the area given to patient.   GAD (generalized anxiety disorder) Stable. No SI or HI. Refill of klonopin given today. List of psyciathrists in the area given.   Essential hypertension At goal. Continue amlodipine.   Bryan Collins. Bryan Collins, [place] Resident PGY-3 12/22/2016 4:06 PM

## 2016-12-22 NOTE — Assessment & Plan Note (Signed)
At goal. Continue amlodipine.  

## 2016-12-22 NOTE — Assessment & Plan Note (Signed)
Stable. No SI or HI. Refill of klonopin given today. List of psyciathrists in the area given.

## 2016-12-22 NOTE — Patient Instructions (Addendum)
We we will check for h pylori again today.  Please bring back the urine specimen.  Please start back the bentyl.  Look into trying peppermint oil  You do not need any changes to your medications today.   Come back to see me in 1 month.  Take care,  Dr Jerline Pain

## 2016-12-22 NOTE — Assessment & Plan Note (Signed)
Symptoms stable today. No SI or HI. Zoloft refilled. List of psychiatrists in the area given to patient.

## 2016-12-22 NOTE — Assessment & Plan Note (Signed)
Patient not adherent to bentyl and has not looked into FODMAPS. H pylori tested again today per patient request and was negative. Encouraged regular use of bentyl. Will check 5-HIAA today to rule out carcinoid syndrome. If negative and patient continues to have symptoms, consider GI referral.

## 2017-01-01 NOTE — Addendum Note (Signed)
Addended by: [place] Pink on: 01/01/2017 02:13 PM   Modules accepted: Orders

## 2017-01-11 NOTE — Progress Notes (Signed)
Cancelling order as lab not collected, no need to re-order 

## 2017-01-12 LAB — 5 HIAA, QUANTITATIVE, URINE, 24 HOUR

## 2017-01-13 ENCOUNTER — Other Ambulatory Visit: Payer: Self-pay | Admitting: Family Medicine

## 2017-01-14 ENCOUNTER — Telehealth: Payer: Self-pay | Admitting: Family Medicine

## 2017-01-14 NOTE — Telephone Encounter (Signed)
Mom would like someone to call pt's cell phone with lab results. ep

## 2017-01-14 NOTE — Telephone Encounter (Signed)
Will forward to MD to advise, not sure if labs are back since there is note from [place] stating there was an error with the sample.  Yashar Inclan,CMA

## 2017-01-14 NOTE — Telephone Encounter (Signed)
Clicked sign encounter instead of routing. Bryan Collins,CMA

## 2017-01-15 NOTE — Telephone Encounter (Signed)
It appears that the sample was compromised and that [facility] was to call patient for recollection.  Bryan Collins, can you help [place] with this?

## 2017-01-19 ENCOUNTER — Telehealth: Payer: Self-pay | Admitting: Family Medicine

## 2017-01-19 NOTE — Telephone Encounter (Signed)
Would like results from urine sample brought in 2 weeks ago

## 2017-01-25 NOTE — Telephone Encounter (Signed)
I received a message from the lab that they weren't able to run the specimen. They said they would contact the patient for recollection.  I will forward to Bryan Collins to see if he knows anything about this.  Algis Greenhouse. Jerline Pain, [place] Resident PGY-3 01/25/2017 1:41 PM

## 2017-01-25 NOTE — Telephone Encounter (Signed)
Called again about lab results.

## 2017-01-25 NOTE — Telephone Encounter (Signed)
Will forward to MD to advise. Jazmin Hartsell,CMA  

## 2017-02-03 ENCOUNTER — Telehealth: Payer: Self-pay | Admitting: Gastroenterology

## 2017-02-03 NOTE — Telephone Encounter (Signed)
Spoke with the patient. He reports he thinks he coughed up blood in May. None since then. He is having 1 bowel movement a day, but it is very foul smelling. His abdomen hurts like a "dull ache". This has been "going on for months." He doesn't feel well and has decided to have it looked into. He wants an appointment.  Scheduled an appointment. Offered appointment tomorrow but he is unable to come in.

## 2017-02-08 ENCOUNTER — Ambulatory Visit: Payer: Medicaid Other | Admitting: Physician Assistant

## 2017-02-10 ENCOUNTER — Ambulatory Visit (INDEPENDENT_AMBULATORY_CARE_PROVIDER_SITE_OTHER): Payer: Medicaid Other | Admitting: Gastroenterology

## 2017-02-10 ENCOUNTER — Encounter: Payer: Self-pay | Admitting: Gastroenterology

## 2017-02-10 ENCOUNTER — Encounter (INDEPENDENT_AMBULATORY_CARE_PROVIDER_SITE_OTHER): Payer: Self-pay

## 2017-02-10 ENCOUNTER — Other Ambulatory Visit (INDEPENDENT_AMBULATORY_CARE_PROVIDER_SITE_OTHER): Payer: Medicaid Other

## 2017-02-10 VITALS — BP 136/92 | HR 112 | Ht 69.0 in | Wt 277.2 lb

## 2017-02-10 DIAGNOSIS — R1084 Generalized abdominal pain: Secondary | ICD-10-CM

## 2017-02-10 DIAGNOSIS — R630 Anorexia: Secondary | ICD-10-CM

## 2017-02-10 DIAGNOSIS — K219 Gastro-esophageal reflux disease without esophagitis: Secondary | ICD-10-CM

## 2017-02-10 DIAGNOSIS — Z720 Tobacco use: Secondary | ICD-10-CM

## 2017-02-10 DIAGNOSIS — K2270 Barrett's esophagus without dysplasia: Secondary | ICD-10-CM | POA: Insufficient documentation

## 2017-02-10 DIAGNOSIS — R110 Nausea: Secondary | ICD-10-CM

## 2017-02-10 DIAGNOSIS — Z8719 Personal history of other diseases of the digestive system: Secondary | ICD-10-CM

## 2017-02-10 DIAGNOSIS — R194 Change in bowel habit: Secondary | ICD-10-CM

## 2017-02-10 DIAGNOSIS — Z86010 Personal history of colonic polyps: Secondary | ICD-10-CM | POA: Diagnosis not present

## 2017-02-10 DIAGNOSIS — Z716 Tobacco abuse counseling: Secondary | ICD-10-CM | POA: Diagnosis not present

## 2017-02-10 LAB — CBC WITH DIFFERENTIAL/PLATELET
BASOS PCT: 0.7 % (ref 0.0–3.0)
Basophils Absolute: 0.1 10*3/uL (ref 0.0–0.1)
EOS ABS: 0.1 10*3/uL (ref 0.0–0.7)
EOS PCT: 0.5 % (ref 0.0–5.0)
HEMATOCRIT: 58.1 % — AB (ref 39.0–52.0)
LYMPHS PCT: 13.6 % (ref 12.0–46.0)
Lymphs Abs: 1.9 10*3/uL (ref 0.7–4.0)
MCHC: 34.1 g/dL (ref 30.0–36.0)
MCV: 90.5 fl (ref 78.0–100.0)
MONOS PCT: 7.5 % (ref 3.0–12.0)
Monocytes Absolute: 1 10*3/uL (ref 0.1–1.0)
NEUTROS ABS: 10.7 10*3/uL — AB (ref 1.4–7.7)
Neutrophils Relative %: 77.7 % — ABNORMAL HIGH (ref 43.0–77.0)
PLATELETS: 173 10*3/uL (ref 150.0–400.0)
RBC: 6.43 Mil/uL — ABNORMAL HIGH (ref 4.22–5.81)
RDW: 14.3 % (ref 11.5–15.5)
WBC: 13.7 10*3/uL — AB (ref 4.0–10.5)

## 2017-02-10 LAB — COMPREHENSIVE METABOLIC PANEL
ALBUMIN: 4.4 g/dL (ref 3.5–5.2)
ALT: 33 U/L (ref 0–53)
AST: 17 U/L (ref 0–37)
Alkaline Phosphatase: 92 U/L (ref 39–117)
BUN: 11 mg/dL (ref 6–23)
CALCIUM: 10.3 mg/dL (ref 8.4–10.5)
CHLORIDE: 103 meq/L (ref 96–112)
CO2: 31 mEq/L (ref 19–32)
CREATININE: 1.35 mg/dL (ref 0.40–1.50)
GFR: 58.83 mL/min — AB (ref >60.00)
Glucose, Bld: 100 mg/dL — ABNORMAL HIGH (ref 70–99)
Potassium: 4.8 mEq/L (ref 3.5–5.1)
Sodium: 141 mEq/L (ref 135–145)
Total Bilirubin: 0.4 mg/dL (ref 0.2–1.2)
Total Protein: 7.6 g/dL (ref 6.0–8.3)

## 2017-02-10 MED ORDER — OMEPRAZOLE 40 MG PO CPDR: 40.0000 mg | DELAYED_RELEASE_CAPSULE | Freq: Two times a day (BID) | ORAL | 3 refills | Status: DC

## 2017-02-10 NOTE — Patient Instructions (Signed)
You have been scheduled for an endoscopy. Please follow written instructions given to you at your visit today. If you use inhalers (even only as needed), please bring them with you on the day of your procedure. Your physician has requested that you go to www.startemmi.com and enter the access code given to you at your visit today. This web site gives a general overview about your procedure. However, you should still follow specific instructions given to you by our office regarding your preparation for the procedure.  Go to the basement for labs today  Use IB Gard 1 capsule three times a day   Take VSL 112 U probiotic daily, this is purchased over the counter  We will send Omeprazole to your pharmacy

## 2017-02-10 NOTE — Progress Notes (Signed)
Bryan Collins    [id number]    December 05, 1965  Primary Care Physician:Parker, Algis Greenhouse, MD  Referring Physician: Vivi Barrack, MD [address], [place] [address]  Chief complaint: Nausea, fatigue, abdominal pain, hematemesis, change in bowel habits   HPI: 51 year old male here for follow-up visit accompanied by his mother. EGD May 2017 showed long segment Barrett's esophagus with no dysplasia. He hasn't been feeling good for past 9 months or so. He feels sick like he has flu with chills. Nausea on most days about 3-4 times a week. No vomiting. He was coughing up blood followed by vomiting blood for 3 days last week of May (25-27). He stopped taking omeprazole as he doesn't have any heartburn . Taking Pepto-Bismol occasionally . He complains of hot sweats after a meal. He doesn't have an appetite , has been mostly drinking fluids . He drinks 2 pots of coffee a day and smokes throughout the day . According to his mom he always has a cup of coffee and his cigarette in other hand.  Also continues to take 4-5 aspirin a day. No dysphagia, odynophagia. Has intermittent epigastric abdominal pain with no clear exacerbating or relieving factors  Complaints of foul-smelling bowel movement and diarrhea, 1 semi-formed bowel movement daily. No fecal urgency or incontinence. He tried nature made probiotic with no significant difference. Denies any hematochezia or blood per rectum.    Outpatient Encounter Prescriptions as of 02/10/2017  Medication Sig  . ADVAIR DISKUS 250-50 MCG/DOSE AEPB INHALE 1 PUFF EVERY DAY  . albuterol (PROVENTIL HFA;VENTOLIN HFA) 108 (90 BASE) MCG/ACT inhaler Inhale 2 puffs into the lungs every 6 (six) hours as needed for wheezing or shortness of breath.  Marland Kitchen amLODipine (NORVASC) 5 MG tablet Take 1 tablet (5 mg total) by mouth daily.  Marland Kitchen aspirin 325 MG tablet Take 1,300 mg by mouth daily.  Marland Kitchen bismuth subsalicylate (PEPTO BISMOL) 262 MG/15ML suspension Take 30  mLs by mouth as needed.  . clonazePAM (KLONOPIN) 0.5 MG tablet Take 1 tablet (0.5 mg total) by mouth every 6 (six) hours as needed for anxiety.  . sertraline (ZOLOFT) 100 MG tablet Take 2 tablets (200 mg total) by mouth daily.  . [DISCONTINUED] dicyclomine (BENTYL) 20 MG tablet TAKE 1 TABLET BY MOUTH 4 TIMES A DAY BEFORE MEALS AND AT BEDTIME  . [DISCONTINUED] gabapentin (NEURONTIN) 300 MG capsule Take 1 capsule (300 mg total) by mouth 3 (three) times daily.  . [DISCONTINUED] omeprazole (PRILOSEC) 40 MG capsule Take 1 capsule (40 mg total) by mouth 2 (two) times daily before a meal.   No facility-administered encounter medications on file as of 02/10/2017.     Allergies as of 02/10/2017 - Review Complete 02/10/2017  Allergen Reaction Noted  . Penicillins Hives 05/08/2014    Past Medical History:  Diagnosis Date  . Anxiety   . Asthma   . Depression   . Diverticulitis   . GERD (gastroesophageal reflux disease)   . HTN (hypertension)     Past Surgical History:  Procedure Laterality Date  . CHOLECYSTECTOMY      Family History  Problem Relation Age of Onset  . Breast cancer Mother   . Asthma Mother   . Gallstones Mother   . Heart attack Father   . Diabetes Father   . Prostate cancer Father   . Gallbladder disease Maternal Grandmother   . Stroke Maternal Grandmother  Social History   Social History  . Marital status: Single    Spouse name: N/A  . Number of children: 0  . Years of education: N/A   Occupational History  . disabled    Social History Main Topics  . Smoking status: Current Every Day Smoker    Packs/day: 2.00    Years: 30.00    Types: Cigars, Cigarettes    Start date: 01/22/1985  . Smokeless tobacco: Never Used  . Alcohol use 0.0 oz/week     Comment: occasional  . Drug use: Yes     Comment: recreational marijuana use  . Sexual activity: Not Currently   Other Topics Concern  . Not on file   Social History Narrative  . No narrative on file       Review of systems: Review of Systems  Constitutional: Negative for fever and chills.  suggestive for lack of energy HENT: Negative.   Eyes: Negative for blurred vision.  Respiratory: Negative for cough, shortness of breath and wheezing.   Cardiovascular: Negative for chest pain and palpitations.  Gastrointestinal: as per HPI Genitourinary: Negative for dysuria, urgency, frequency and hematuria.  Musculoskeletal: Negative for myalgias, back pain and joint pain.  Skin: Negative for itching and rash.  Neurological: Negative for dizziness, tremors, focal weakness, seizures and loss of consciousness.  Endo/Heme/Allergies: Negative for seasonal allergies.  Psychiatric/Behavioral: Negative for  suicidal ideas and hallucinations.  positive for depression, anxiety, insomnia, irritability All other systems reviewed and are negative.   Physical Exam: Vitals:   02/10/17 1453  BP: (!) 136/92  Pulse: (!) 112   Body mass index is 40.94 kg/m. Gen:      No acute distress HEENT:  EOMI, sclera anicteric Neck:     No masses; no thyromegaly Lungs:    Clear to auscultation bilaterally; normal respiratory effort CV:         Regular rate and rhythm; no murmurs Abd:      + bowel sounds; soft, non-tender; no palpable masses, no distension Ext:    No edema; adequate peripheral perfusion Skin:      Warm and dry; no rash Neuro: alert and oriented x 3 Psych: normal mood and affect  Data Reviewed:  Reviewed labs, radiology imaging, old records and pertinent past GI work up   Assessment and Plan/Recommendations:  51 year old male with obesity, chronic smoker greater than 30 years, GERD, long segment Barrett's esophagus here with complaint of lack of appetite, nausea, history of hematemesis episode last month, abdominal pain and change in bowel habits.  We'll schedule EGD to evaluate for hematemesis and also for surveillance of Barrett's esophagus as he continues to have increased risk factors  with continued smoking and uncontrolled acid reflux The risks and benefits as well as alternatives of endoscopic procedure(s) have been discussed and reviewed. All questions answered. The patient agrees to proceed.  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 Denzil Magnuson , MD [phone] Mon-Fri 8a-5p [phone] after 5p, weekends, holidays  CC: Vivi Barrack, MD

## 2017-02-12 ENCOUNTER — Encounter: Payer: Self-pay | Admitting: Gastroenterology

## 2017-02-12 ENCOUNTER — Ambulatory Visit (AMBULATORY_SURGERY_CENTER): Payer: Medicaid Other | Admitting: Gastroenterology

## 2017-02-12 VITALS — BP 118/82 | HR 101 | Temp 97.3°F | Resp 12 | Ht 69.0 in | Wt 277.0 lb

## 2017-02-12 DIAGNOSIS — K2270 Barrett's esophagus without dysplasia: Secondary | ICD-10-CM | POA: Diagnosis not present

## 2017-02-12 MED ORDER — SODIUM CHLORIDE 0.9 % IV SOLN: 500.0000 mL | INTRAVENOUS | Status: DC

## 2017-02-12 NOTE — Patient Instructions (Signed)
YOU HAD AN ENDOSCOPIC PROCEDURE TODAY AT [facility]:   Refer to the procedure report that was given to you for any specific questions about what was found during the examination.  If the procedure report does not answer your questions, please call your gastroenterologist to clarify.  If you requested that your care partner not be given the details of your procedure findings, then the procedure report has been included in a sealed envelope for you to review at your convenience later.  YOU SHOULD EXPECT: Some feelings of bloating in the abdomen. Passage of more gas than usual.  Walking can help get rid of the air that was put into your GI tract during the procedure and reduce the bloating.   Please Note:  You might notice some irritation and congestion in your nose or some drainage.  This is from the oxygen used during your procedure.  There is no need for concern and it should clear up in a day or so.  SYMPTOMS TO REPORT IMMEDIATELY:    Following upper endoscopy (EGD)  Vomiting of blood or coffee ground material  New chest pain or pain under the shoulder blades  Painful or persistently difficult swallowing  New shortness of breath  Fever of 100F or higher  Black, tarry-looking stools  For urgent or emergent issues, a gastroenterologist can be reached at any hour by calling [phone].   DIET:  We do recommend a small meal at first, but then you may proceed to your regular diet.  Drink plenty of fluids but you should avoid alcoholic beverages for 24 hours. Follow a low acid diet if at all possible.  ACTIVITY:  You should plan to take it easy for the rest of today and you should NOT DRIVE or use heavy machinery until tomorrow (because of the sedation medicines used during the test).    FOLLOW UP: Our staff will call the number listed on your records the next business day following your procedure to check on you and address any questions or concerns that you may have  regarding the information given to you following your procedure. If we do not reach you, we will leave a message.  However, if you are feeling well and you are not experiencing any problems, there is no need to return our call.  We will assume that you have returned to your regular daily activities without incident.  If any biopsies were taken you will be contacted by phone or by letter within the next 1-3 weeks.  Please call us at [phone] if you have not heard about the biopsies in 3 weeks.    SIGNATURES/CONFIDENTIALITY: You and/or your care partner have signed paperwork which will be entered into your electronic medical record.  These signatures attest to the fact that that the information above on your After Visit Summary has been reviewed and is understood.  Full responsibility of the confidentiality of this discharge information lies with you and/or your care-partner.  Stop smoking.  The smoking can make your issues worse.    No NSAIDS nor Aspirin, because it may really irritate your stomach.

## 2017-02-12 NOTE — Op Note (Signed)
[facility] Patient Name: Bryan Collins Procedure Date: 02/12/2017 10:10 AM MRN: [id number] Endoscopist: Mauri Pole , MD Age: 51 Referring MD:  Date of Birth: [birth year] Gender: Male Account #: [id number] Procedure:                Upper GI endoscopy Indications:              Suspected upper gastrointestinal bleeding,                            Surveillance for malignancy due to personal history                            of Barrett's esophagus, Epigastric abdominal pain Medicines:                Monitored Anesthesia Care Procedure:                Pre-Anesthesia Assessment:                           - Prior to the procedure, a History and Physical                            was performed, and patient medications and                            allergies were reviewed. The patient's tolerance of                            previous anesthesia was also reviewed. The risks                            and benefits of the procedure and the sedation                            options and risks were discussed with the patient.                            All questions were answered, and informed consent                            was obtained. Prior Anticoagulants: The patient has                            taken no previous anticoagulant or antiplatelet                            agents. ASA Grade Assessment: III - A patient with                            severe systemic disease. After reviewing the risks                            and benefits, the patient was deemed in  satisfactory condition to undergo the procedure.                           After obtaining informed consent, the endoscope was                            passed under direct vision. Throughout the                            procedure, the patient's blood pressure, pulse, and                            oxygen saturations were monitored continuously. The   Endoscope was introduced through the mouth, and                            advanced to the second part of duodenum. The upper                            GI endoscopy was accomplished without difficulty.                            The patient tolerated the procedure well. Scope In: Scope Out: Findings:                 The esophagus and gastroesophageal junction were                            examined with white light and narrow band imaging                            (NBI) from a forward view and retroflexed position.                            There were esophageal mucosal changes secondary to                            established long-segment Barrett's disease. These                            changes involved the mucosa at the upper extent of                            the gastric folds (40 cm from the incisors)                            extending to the Z-line (30 cm from the incisors).                            Circumferential salmon-colored mucosa was present                            from 32 to 40 cm, three tongues of salmon-colored  mucosa were present at 30 cm and squamous islands                            were present from 32 to 36 cm. The maximum                            longitudinal extent of these esophageal mucosal                            changes was 10 cm in length. Mucosa was biopsied                            with a cold forceps for histology in 4 quadrants at                            intervals of 1 cm. A total of 6 specimen bottles                            were sent to pathology.                           Scattered mild inflammation characterized by                            congestion (edema), erythema and granularity was                            found in the entire examined stomach. Small hiatal                            hernia 2-3cm.                           Patchy mildly erythematous mucosa without active                             bleeding and with no stigmata of bleeding was found                            in the duodenal bulb.                           The second portion of the duodenum was normal.                            Biopsies were taken with a cold forceps for                            histology. Complications:            No immediate complications. Estimated Blood Loss:     Estimated blood loss was minimal. Impression:               - Esophageal mucosal changes secondary to  established long-segment Barrett's disease.                            Biopsied.                           - Gastritis.                           - Erythematous duodenopathy.                           - Normal second portion of the duodenum. Biopsied. Recommendation:           - Resume previous diet.                           - Continue present medications.                           - Await pathology results.                           - No aspirin, ibuprofen, naproxen, or other                            non-steroidal anti-inflammatory drugs.                           - Repeat upper endoscopy after studies are complete                            for surveillance based on pathology results.                           - Return to GI clinic in 3 months. Mauri Pole, MD 02/12/2017 10:49:20 AM This report has been signed electronically.

## 2017-02-12 NOTE — Progress Notes (Signed)
To recovery, report to Hodges, RN, VSS 

## 2017-02-12 NOTE — Progress Notes (Signed)
Called to room to assist during endoscopic procedure.  Patient ID and intended procedure confirmed with present staff. Received instructions for my participation in the procedure from the performing physician.  

## 2017-02-15 ENCOUNTER — Telehealth: Payer: Self-pay | Admitting: *Deleted

## 2017-02-15 NOTE — Telephone Encounter (Signed)
  Follow up Call-  Call back number 02/12/2017 12/31/2015  Post procedure Call Back phone  # [phone] hm [phone]  Permission to leave phone message Yes Yes  Some recent data might be hidden     Patient questions:  Do you have a fever, pain , or abdominal swelling? No. Pain Score  0 *  Have you tolerated food without any problems? Yes.    Have you been able to return to your normal activities? Yes.    Do you have any questions about your discharge instructions: Diet   No. Medications  No. Follow up visit  No.  Do you have questions or concerns about your Care? No.  Actions: * If pain score is 4 or above: No action needed, pain <4.

## 2017-02-15 NOTE — Telephone Encounter (Signed)
  Follow up Call-  Call back number 02/12/2017 12/31/2015  Post procedure Call Back phone  # [phone] hm [phone]  Permission to leave phone message Yes Yes  Some recent data might be hidden     No answer at # given.  LM on VM.

## 2017-02-17 ENCOUNTER — Encounter: Payer: Self-pay | Admitting: Gastroenterology

## 2017-02-27 ENCOUNTER — Other Ambulatory Visit: Payer: Self-pay | Admitting: Family Medicine

## 2017-03-12 ENCOUNTER — Other Ambulatory Visit: Payer: Self-pay | Admitting: *Deleted

## 2017-03-12 NOTE — Telephone Encounter (Signed)
Attempted to call patient regarding refill request, but went to voicemail. Left message to call back. Per Dr. Marigene Ehlers note, he was to reestablish with psychiatry for further refills. Will discuss when he calls back.

## 2017-03-16 MED ORDER — CLONAZEPAM 0.5 MG PO TABS: 0.5000 mg | ORAL_TABLET | Freq: Four times a day (QID) | ORAL | 0 refills | Status: DC | PRN

## 2017-03-16 NOTE — Telephone Encounter (Signed)
Called patient to inform that I will prescribe 30 days of Klonopin until he finds a psychiatrist. He understands. Rx Printed and placed at front desk for pick up. Patient aware.   I also mentioned about his elevated RBC on lab work obtained by GI. He reports he does not recall being diagnosed in the past with polycythemia vera. He did get a letter from GI reporting his RBC was elevated. I asked patient to make a follow up appointment with me to further discuss this.

## 2017-04-19 ENCOUNTER — Emergency Department (HOSPITAL_COMMUNITY)
Admission: EM | Admit: 2017-04-19 | Discharge: 2017-04-19 | Disposition: A | Discharge status: Home / Self Care | Payer: Medicaid Other | Attending: Emergency Medicine | Admitting: Emergency Medicine

## 2017-04-19 ENCOUNTER — Encounter (HOSPITAL_COMMUNITY): Payer: Self-pay

## 2017-04-19 DIAGNOSIS — I10 Essential (primary) hypertension: Secondary | ICD-10-CM | POA: Diagnosis not present

## 2017-04-19 DIAGNOSIS — M5442 Lumbago with sciatica, left side: Secondary | ICD-10-CM | POA: Diagnosis not present

## 2017-04-19 DIAGNOSIS — F17210 Nicotine dependence, cigarettes, uncomplicated: Secondary | ICD-10-CM | POA: Insufficient documentation

## 2017-04-19 DIAGNOSIS — M5432 Sciatica, left side: Secondary | ICD-10-CM

## 2017-04-19 DIAGNOSIS — J45909 Unspecified asthma, uncomplicated: Secondary | ICD-10-CM | POA: Insufficient documentation

## 2017-04-19 DIAGNOSIS — M545 Low back pain: Secondary | ICD-10-CM | POA: Diagnosis present

## 2017-04-19 DIAGNOSIS — Z7982 Long term (current) use of aspirin: Secondary | ICD-10-CM | POA: Diagnosis not present

## 2017-04-19 DIAGNOSIS — Z79899 Other long term (current) drug therapy: Secondary | ICD-10-CM | POA: Insufficient documentation

## 2017-04-19 HISTORY — DX: Diaphragmatic hernia without obstruction or gangrene: K44.9

## 2017-04-19 HISTORY — DX: Gastritis, unspecified, without bleeding: K29.70

## 2017-04-19 MED ORDER — CYCLOBENZAPRINE HCL 10 MG PO TABS
10.0000 mg | ORAL_TABLET | Freq: Once | ORAL | Status: AC
  Administered 2017-04-19: 10 mg via ORAL
  Filled 2017-04-19: qty 1

## 2017-04-19 MED ORDER — OXYCODONE-ACETAMINOPHEN 5-325 MG PO TABS
1.0000 | ORAL_TABLET | Freq: Once | ORAL | Status: AC
  Administered 2017-04-19: 1 via ORAL
  Filled 2017-04-19: qty 1

## 2017-04-19 MED ORDER — CYCLOBENZAPRINE HCL 10 MG PO TABS: 10.0000 mg | ORAL_TABLET | Freq: Two times a day (BID) | ORAL | 0 refills | Status: DC | PRN

## 2017-04-19 MED ORDER — PREDNISONE 10 MG PO TABS: 20.0000 mg | ORAL_TABLET | Freq: Two times a day (BID) | ORAL | 0 refills | Status: DC

## 2017-04-19 NOTE — ED Provider Notes (Signed)
[facility] DEPT Provider Note   CSN: [id number] Arrival date & time: 04/19/17  8242     History   Chief Complaint Chief Complaint  Patient presents with  . Back Pain    HPI Bryan Collins is a 51 y.o. male.  The history is provided by the patient. No language interpreter was used.  Back Pain   This is a new problem. The current episode started more than 1 week ago. The problem occurs constantly. The problem has been gradually worsening. The pain is associated with no known injury. The pain is present in the lumbar spine. The pain radiates to the left thigh and left knee. The pain is at a severity of 9/10. The symptoms are aggravated by bending, twisting and certain positions. The pain is the same all the time. Associated symptoms include leg pain (left) and tingling. Pertinent negatives include no chest pain, no fever, no weight loss, no headaches, no abdominal pain, no bowel incontinence, no bladder incontinence, no dysuria and no weakness. He has tried NSAIDs for the symptoms.    Past Medical History:  Diagnosis Date  . Anxiety   . Asthma   . Barrett's esophagus   . Depression   . Diverticulitis   . Gastritis   . GERD (gastroesophageal reflux disease)   . Hiatal hernia   . HTN (hypertension)     Patient Active Problem List   Diagnosis Date Noted  . History of colonic polyps 02/10/2017  . Barrett's esophagus without dysplasia 02/10/2017  . Essential hypertension 06/25/2016  . IBS (irritable bowel syndrome) 06/16/2016  . Asthma, chronic 01/25/2015  . MDD (major depressive disorder) 01/25/2015  . GAD (generalized anxiety disorder) 01/25/2015  . Tobacco use disorder 01/25/2015  . Sebaceous cyst 01/25/2015  . Epigastric pain 01/25/2015  . Healthcare maintenance 01/25/2015    Past Surgical History:  Procedure Laterality Date  . CHOLECYSTECTOMY         Home Medications    Prior to Admission medications   Medication Sig Start Date End Date Taking?  Authorizing Provider  ADVAIR DISKUS 250-50 MCG/DOSE AEPB INHALE 1 PUFF EVERY DAY 01/13/17   Ronnie Doss M, DO  albuterol (PROVENTIL HFA;VENTOLIN HFA) 108 (90 BASE) MCG/ACT inhaler Inhale 2 puffs into the lungs every 6 (six) hours as needed for wheezing or shortness of breath. 01/25/15   Vivi Barrack, MD  amLODipine (NORVASC) 5 MG tablet Take 1 tablet (5 mg total) by mouth daily. 06/25/16   Vivi Barrack, MD  aspirin 325 MG tablet Take 1,300 mg by mouth daily.    [provider]  bismuth subsalicylate (PEPTO BISMOL) 262 MG/15ML suspension Take 30 mLs by mouth as needed.    [provider]  clonazePAM (KLONOPIN) 0.5 MG tablet Take 1 tablet (0.5 mg total) by mouth every 6 (six) hours as needed for anxiety. 03/16/17   Smiley Houseman, MD  cyclobenzaprine (FLEXERIL) 10 MG tablet Take 1 tablet (10 mg total) by mouth 2 (two) times daily as needed for muscle spasms. 04/19/17   Ashley Murrain, NP  omeprazole (PRILOSEC) 40 MG capsule Take 1 capsule (40 mg total) by mouth 2 (two) times daily before lunch and supper. 02/10/17   Mauri Pole, MD  predniSONE (DELTASONE) 10 MG tablet Take 2 tablets (20 mg total) by mouth 2 (two) times daily with a meal. 04/19/17   Janit Bern, [place], NP  sertraline (ZOLOFT) 100 MG tablet Take 2 tablets (200 mg total) by mouth daily. 12/22/16  Vivi Barrack, MD    Family History Family History  Problem Relation Age of Onset  . Breast cancer Mother   . Asthma Mother   . Gallstones Mother   . Heart attack Father   . Diabetes Father   . Prostate cancer Father   . Gallbladder disease Maternal Grandmother   . Stroke Maternal Grandmother   . Colon cancer Neg Hx   . Esophageal cancer Neg Hx   . Pancreatic cancer Neg Hx   . Rectal cancer Neg Hx   . Stomach cancer Neg Hx     Social History Social History  Substance Use Topics  . Smoking status: Current Every Day Smoker    Packs/day: 1.00    Years: 30.00    Types: Cigarettes    Start date:  01/22/1985  . Smokeless tobacco: Never Used  . Alcohol use 0.0 oz/week     Comment: occasional     Allergies   Penicillins   Review of Systems Review of Systems  Constitutional: Negative for chills, fever and weight loss.  HENT: Negative.   Eyes: Negative for pain, redness and itching.  Respiratory: Negative for cough.   Cardiovascular: Negative for chest pain.  Gastrointestinal: Negative for abdominal pain, bowel incontinence, nausea and vomiting.  Genitourinary: Negative for bladder incontinence, decreased urine volume, dysuria, frequency and urgency.  Musculoskeletal: Positive for back pain. Negative for neck pain and neck stiffness.  Skin: Negative for wound.  Neurological: Positive for tingling. Negative for weakness and headaches.  Psychiatric/Behavioral: Positive for sleep disturbance. Negative for confusion. Nervous/anxious: hx of but taking medication.      Physical Exam Updated Vital Signs BP (!) 145/104 (BP Location: Left Arm)   Pulse (!) 111   Temp 98.2 F (36.8 C) (Oral)   Resp 17   Ht 5\' 10"  (1.778 m)   Wt 124.7 kg (275 lb)   SpO2 97%   BMI 39.46 kg/m   Physical Exam  Constitutional: He is oriented to person, place, and time. He appears well-developed and well-nourished. No distress.  HENT:  Head: Normocephalic and atraumatic.  Mouth/Throat: Mucous membranes are normal.  Eyes: Pupils are equal, round, and reactive to light. EOM are normal.  Neck: Normal range of motion. Neck supple.  Cardiovascular: Normal rate, regular rhythm and intact distal pulses.   Pulmonary/Chest: Effort normal and breath sounds normal. He has no wheezes.  Abdominal: Soft. Bowel sounds are normal. There is no tenderness.  Musculoskeletal: He exhibits no edema.       Lumbar back: He exhibits tenderness and spasm. He exhibits no deformity and normal pulse. Decreased range of motion: due to pain.       Back:  Neurological: He is alert and oriented to person, place, and time. He  has normal strength. No cranial nerve deficit or sensory deficit. He displays a negative Romberg sign. Coordination and gait normal.  Reflex Scores:      Bicep reflexes are 2+ on the right side and 2+ on the left side.      Brachioradialis reflexes are 2+ on the right side and 2+ on the left side.      Patellar reflexes are 2+ on the right side and 2+ on the left side. Skin: Skin is warm and dry.  Psychiatric: He has a normal mood and affect. His behavior is normal.  Nursing note and vitals reviewed.    ED Treatments / Results  Labs (all labs ordered are listed, but only abnormal results are displayed) Labs Reviewed -  No data to display  EKG  EKG Interpretation None       Radiology No results found.  Procedures Procedures (including critical care time)  Medications Ordered in ED Medications  oxyCODONE-acetaminophen (PERCOCET/ROXICET) 5-325 MG per tablet 1 tablet (not administered)  cyclobenzaprine (FLEXERIL) tablet 10 mg (not administered)     Initial Impression / Assessment and Plan / ED Course  I have reviewed the triage vital signs and the nursing notes.  Patient with back pain.  No neurological deficits and normal neuro exam.  Patient can walk but states is painful.  No loss of bowel or bladder control.  No concern for cauda equina.  No fever, night sweats, weight loss, h/o cancer, IVDU.  RICE protocol and pain medicine indicated and discussed with patient.   Final Clinical Impressions(s) / ED Diagnoses   Final diagnoses:  Sciatica of left side    New Prescriptions New Prescriptions   CYCLOBENZAPRINE (FLEXERIL) 10 MG TABLET    Take 1 tablet (10 mg total) by mouth 2 (two) times daily as needed for muscle spasms.   PREDNISONE (DELTASONE) 10 MG TABLET    Take 2 tablets (20 mg total) by mouth 2 (two) times daily with a meal.     Debroah Baller McMillin, NP 04/19/17 1149    Daleen Bo, MD 04/20/17 [phone]

## 2017-04-19 NOTE — ED Triage Notes (Signed)
Patient c/o left lower back pain that radiates into the left leg x 8 days.

## 2017-04-19 NOTE — Discharge Instructions (Signed)
Follow up with your primary care doctor or return here for worsening symptoms.  °

## 2017-04-21 ENCOUNTER — Ambulatory Visit: Payer: Medicaid Other | Admitting: Gastroenterology

## 2017-04-27 ENCOUNTER — Emergency Department (HOSPITAL_COMMUNITY): Payer: Medicaid Other

## 2017-04-27 ENCOUNTER — Other Ambulatory Visit: Payer: Self-pay

## 2017-04-27 ENCOUNTER — Inpatient Hospital Stay (HOSPITAL_COMMUNITY)
Admission: EM | Admit: 2017-04-27 | Discharge: 2017-04-29 | DRG: 983 | Disposition: A | Discharge status: Home / Self Care | Payer: Medicaid Other | Attending: Family Medicine | Admitting: Family Medicine

## 2017-04-27 ENCOUNTER — Encounter (HOSPITAL_COMMUNITY): Payer: Self-pay | Admitting: Emergency Medicine

## 2017-04-27 DIAGNOSIS — Z825 Family history of asthma and other chronic lower respiratory diseases: Secondary | ICD-10-CM | POA: Diagnosis not present

## 2017-04-27 DIAGNOSIS — F411 Generalized anxiety disorder: Secondary | ICD-10-CM | POA: Diagnosis present

## 2017-04-27 DIAGNOSIS — I482 Chronic atrial fibrillation, unspecified: Secondary | ICD-10-CM | POA: Insufficient documentation

## 2017-04-27 DIAGNOSIS — L0291 Cutaneous abscess, unspecified: Secondary | ICD-10-CM

## 2017-04-27 DIAGNOSIS — Z8249 Family history of ischemic heart disease and other diseases of the circulatory system: Secondary | ICD-10-CM

## 2017-04-27 DIAGNOSIS — K2270 Barrett's esophagus without dysplasia: Secondary | ICD-10-CM | POA: Diagnosis present

## 2017-04-27 DIAGNOSIS — Z833 Family history of diabetes mellitus: Secondary | ICD-10-CM | POA: Diagnosis not present

## 2017-04-27 DIAGNOSIS — K6812 Psoas muscle abscess: Principal | ICD-10-CM

## 2017-04-27 DIAGNOSIS — I10 Essential (primary) hypertension: Secondary | ICD-10-CM | POA: Diagnosis present

## 2017-04-27 DIAGNOSIS — E876 Hypokalemia: Secondary | ICD-10-CM | POA: Diagnosis present

## 2017-04-27 DIAGNOSIS — Z880 Allergy status to penicillin: Secondary | ICD-10-CM | POA: Diagnosis not present

## 2017-04-27 DIAGNOSIS — M545 Low back pain: Secondary | ICD-10-CM | POA: Diagnosis present

## 2017-04-27 DIAGNOSIS — Z79899 Other long term (current) drug therapy: Secondary | ICD-10-CM | POA: Diagnosis not present

## 2017-04-27 DIAGNOSIS — F329 Major depressive disorder, single episode, unspecified: Secondary | ICD-10-CM | POA: Diagnosis present

## 2017-04-27 DIAGNOSIS — Z9049 Acquired absence of other specified parts of digestive tract: Secondary | ICD-10-CM

## 2017-04-27 DIAGNOSIS — J45909 Unspecified asthma, uncomplicated: Secondary | ICD-10-CM | POA: Diagnosis present

## 2017-04-27 DIAGNOSIS — K219 Gastro-esophageal reflux disease without esophagitis: Secondary | ICD-10-CM | POA: Diagnosis present

## 2017-04-27 DIAGNOSIS — Z7951 Long term (current) use of inhaled steroids: Secondary | ICD-10-CM

## 2017-04-27 DIAGNOSIS — A419 Sepsis, unspecified organism: Secondary | ICD-10-CM

## 2017-04-27 DIAGNOSIS — F17210 Nicotine dependence, cigarettes, uncomplicated: Secondary | ICD-10-CM | POA: Diagnosis present

## 2017-04-27 LAB — COMPREHENSIVE METABOLIC PANEL
ALBUMIN: 3.9 g/dL (ref 3.5–5.0)
ALT: 50 U/L (ref 17–63)
ANION GAP: 10 (ref 5–15)
AST: 25 U/L (ref 15–41)
Alkaline Phosphatase: 91 U/L (ref 38–126)
BILIRUBIN TOTAL: 0.8 mg/dL (ref 0.3–1.2)
BUN: 14 mg/dL (ref 6–20)
CO2: 27 mmol/L (ref 22–32)
Calcium: 9.2 mg/dL (ref 8.9–10.3)
Chloride: 102 mmol/L (ref 101–111)
Creatinine, Ser: 1.25 mg/dL — ABNORMAL HIGH (ref 0.61–1.24)
Glucose, Bld: 136 mg/dL — ABNORMAL HIGH (ref 65–99)
POTASSIUM: 3.2 mmol/L — AB (ref 3.5–5.1)
Sodium: 139 mmol/L (ref 135–145)
TOTAL PROTEIN: 7.2 g/dL (ref 6.5–8.1)

## 2017-04-27 LAB — CBC WITH DIFFERENTIAL/PLATELET
BASOS PCT: 0 %
Basophils Absolute: 0 10*3/uL (ref 0.0–0.1)
Eosinophils Absolute: 0.2 10*3/uL (ref 0.0–0.7)
Eosinophils Relative: 1 %
HEMATOCRIT: 53.6 % — AB (ref 39.0–52.0)
Hemoglobin: 17.9 g/dL — ABNORMAL HIGH (ref 13.0–17.0)
Lymphocytes Relative: 19 %
Lymphs Abs: 3.4 10*3/uL (ref 0.7–4.0)
MCH: 28.1 pg (ref 26.0–34.0)
MCHC: 33.4 g/dL (ref 30.0–36.0)
MCV: 84.1 fL (ref 78.0–100.0)
MONO ABS: 1 10*3/uL (ref 0.1–1.0)
MONOS PCT: 6 %
NEUTROS ABS: 13.4 10*3/uL — AB (ref 1.7–7.7)
Neutrophils Relative %: 74 %
Platelets: 194 10*3/uL (ref 150–400)
RBC: 6.37 MIL/uL — ABNORMAL HIGH (ref 4.22–5.81)
RDW: 13.5 % (ref 11.5–15.5)
WBC: 16.6 10*3/uL — ABNORMAL HIGH (ref 4.0–10.5)

## 2017-04-27 LAB — I-STAT CG4 LACTIC ACID, ED
LACTIC ACID, VENOUS: 2.13 mmol/L — AB (ref 0.5–1.9)
LACTIC ACID, VENOUS: 0.91 mmol/L (ref 0.5–1.9)

## 2017-04-27 LAB — SEDIMENTATION RATE: SED RATE: 3 mm/h (ref 0–16)

## 2017-04-27 LAB — C-REACTIVE PROTEIN: CRP: 1.4 mg/dL — AB (ref <1.0)

## 2017-04-27 MED ORDER — LACTATED RINGERS IV BOLUS (SEPSIS)
1000.0000 mL | Freq: Once | INTRAVENOUS | Status: AC
  Administered 2017-04-27: 1000 mL via INTRAVENOUS

## 2017-04-27 MED ORDER — SODIUM CHLORIDE 0.9 % IV BOLUS (SEPSIS)
1000.0000 mL | Freq: Once | INTRAVENOUS | Status: AC
  Administered 2017-04-27: 1000 mL via INTRAVENOUS

## 2017-04-27 MED ORDER — DEXTROSE 5 % IV SOLN
2.0000 g | Freq: Once | INTRAVENOUS | Status: AC
  Administered 2017-04-27: 2 g via INTRAVENOUS
  Filled 2017-04-27: qty 2

## 2017-04-27 MED ORDER — DEXTROSE 5 % IV SOLN
2.0000 g | Freq: Three times a day (TID) | INTRAVENOUS | Status: DC
  Administered 2017-04-27 – 2017-04-29 (×6): 2 g via INTRAVENOUS
  Filled 2017-04-27 (×9): qty 2

## 2017-04-27 MED ORDER — PANTOPRAZOLE SODIUM 40 MG PO TBEC
40.0000 mg | DELAYED_RELEASE_TABLET | Freq: Every day | ORAL | Status: DC
  Administered 2017-04-27 – 2017-04-29 (×3): 40 mg via ORAL
  Filled 2017-04-27 (×3): qty 1

## 2017-04-27 MED ORDER — HYDROMORPHONE HCL 1 MG/ML IJ SOLN
1.0000 mg | Freq: Once | INTRAMUSCULAR | Status: AC
  Administered 2017-04-27: 1 mg via INTRAVENOUS
  Filled 2017-04-27: qty 1

## 2017-04-27 MED ORDER — VANCOMYCIN HCL IN DEXTROSE 1-5 GM/200ML-% IV SOLN
1000.0000 mg | Freq: Once | INTRAVENOUS | Status: AC
Start: 2017-04-27 — End: 2017-04-27
  Administered 2017-04-27: 1000 mg via INTRAVENOUS
  Filled 2017-04-27: qty 200

## 2017-04-27 MED ORDER — VANCOMYCIN HCL IN DEXTROSE 750-5 MG/150ML-% IV SOLN
750.0000 mg | Freq: Two times a day (BID) | INTRAVENOUS | Status: DC
  Administered 2017-04-27 – 2017-04-29 (×4): 750 mg via INTRAVENOUS
  Filled 2017-04-27 (×5): qty 150

## 2017-04-27 MED ORDER — ACETAMINOPHEN 500 MG PO TABS
1000.0000 mg | ORAL_TABLET | Freq: Once | ORAL | Status: AC
  Administered 2017-04-27: 1000 mg via ORAL
  Filled 2017-04-27: qty 2

## 2017-04-27 MED ORDER — SODIUM CHLORIDE 0.9% FLUSH: 3.0000 mL | INTRAVENOUS | Status: DC | PRN

## 2017-04-27 MED ORDER — HYDROMORPHONE HCL 1 MG/ML PO LIQD
1.0000 mg | ORAL | Status: DC | PRN
  Administered 2017-04-27 (×2): 1 mg via ORAL
  Filled 2017-04-27 (×2): qty 1

## 2017-04-27 MED ORDER — ALBUTEROL SULFATE (2.5 MG/3ML) 0.083% IN NEBU: 3.0000 mL | INHALATION_SOLUTION | Freq: Four times a day (QID) | RESPIRATORY_TRACT | Status: DC | PRN

## 2017-04-27 MED ORDER — SERTRALINE HCL 100 MG PO TABS
200.0000 mg | ORAL_TABLET | Freq: Every day | ORAL | Status: DC
  Administered 2017-04-27 – 2017-04-29 (×3): 200 mg via ORAL
  Filled 2017-04-27 (×3): qty 2

## 2017-04-27 MED ORDER — IOPAMIDOL (ISOVUE-300) INJECTION 61%
INTRAVENOUS | Status: AC
  Administered 2017-04-27: 100 mL
  Filled 2017-04-27: qty 100

## 2017-04-27 MED ORDER — GADOBENATE DIMEGLUMINE 529 MG/ML IV SOLN
20.0000 mL | Freq: Once | INTRAVENOUS | Status: AC | PRN
  Administered 2017-04-27: 20 mL via INTRAVENOUS

## 2017-04-27 MED ORDER — OXYCODONE-ACETAMINOPHEN 5-325 MG PO TABS
2.0000 | ORAL_TABLET | Freq: Once | ORAL | Status: AC
  Administered 2017-04-27: 2 via ORAL
  Filled 2017-04-27: qty 2

## 2017-04-27 MED ORDER — CLONAZEPAM 1 MG PO TABS
1.0000 mg | ORAL_TABLET | Freq: Every day | ORAL | Status: DC
  Administered 2017-04-27 – 2017-04-29 (×3): 1 mg via ORAL
  Filled 2017-04-27 (×3): qty 1

## 2017-04-27 MED ORDER — AMLODIPINE BESYLATE 5 MG PO TABS
5.0000 mg | ORAL_TABLET | Freq: Every day | ORAL | Status: DC
  Administered 2017-04-27 – 2017-04-29 (×3): 5 mg via ORAL
  Filled 2017-04-27 (×3): qty 1

## 2017-04-27 MED ORDER — SODIUM CHLORIDE 0.9% FLUSH
3.0000 mL | Freq: Two times a day (BID) | INTRAVENOUS | Status: DC
  Administered 2017-04-28 – 2017-04-29 (×4): 3 mL via INTRAVENOUS

## 2017-04-27 MED ORDER — POTASSIUM CHLORIDE CRYS ER 20 MEQ PO TBCR
40.0000 meq | EXTENDED_RELEASE_TABLET | Freq: Once | ORAL | Status: AC
  Administered 2017-04-27: 40 meq via ORAL
  Filled 2017-04-27: qty 2

## 2017-04-27 MED ORDER — ONDANSETRON HCL 4 MG/2ML IJ SOLN
4.0000 mg | Freq: Once | INTRAMUSCULAR | Status: AC
  Administered 2017-04-27: 4 mg via INTRAVENOUS
  Filled 2017-04-27: qty 2

## 2017-04-27 MED ORDER — SODIUM CHLORIDE 0.9 % IV SOLN: 250.0000 mL | INTRAVENOUS | Status: DC | PRN

## 2017-04-27 NOTE — ED Notes (Signed)
ED TO INPATIENT HANDOFF REPORT  Name/Age/Gender Bryan Collins 51 y.o. male  Code Status Code Status History    This patient does not have a recorded code status. Please follow your organizational policy for patients in this situation.      Home/SNF/Other Home  Chief Complaint leg pain   Level of Care/Admitting Diagnosis ED Disposition    ED Disposition Condition Comment   Admit  Hospital Area: [facility] [[id number]]  Level of Care: Telemetry [5]  Admit to tele based on following criteria: Other see comments  Comments: intermittent tachycardia  Diagnosis: Chronic atrial fibrillation with R[facility]) [[id number]]  Admitting Physician: Vashti Hey [[id number]]  Attending Physician: Vashti Hey [[id number]]  Estimated length of stay: past midnight tomorrow  Certification:: I certify this patient will need inpatient services for at least 2 midnights  PT Class (Do Not Modify): Inpatient [101]  PT Acc Code (Do Not Modify): Private [1]       Medical History Past Medical History:  Diagnosis Date  . Anxiety   . Asthma   . Barrett's esophagus   . Depression   . Diverticulitis   . Gastritis   . GERD (gastroesophageal reflux disease)   . Hiatal hernia   . HTN (hypertension)     Allergies Allergies  Allergen Reactions  . Penicillins Hives    Pt's identical twin had reaction    IV Location/Drains/Wounds Patient Lines/Drains/Airways Status   Active Line/Drains/Airways    Name:   Placement date:   Placement time:   Site:   Days:   Peripheral IV 04/27/17 Right Antecubital  04/27/17    1046    Antecubital    less than 1   Peripheral IV 04/27/17 Left Antecubital  04/27/17    1245    Antecubital    less than 1          Labs/Imaging Results for orders placed or performed during the hospital encounter of 04/27/17 (from the past 48 hour(s))  CBC with Differential     Status: Abnormal   Collection Time: 04/27/17 10:43 AM  Result Value  Ref Range   WBC 16.6 (H) 4.0 - 10.5 K/uL   RBC 6.37 (H) 4.22 - 5.81 MIL/uL   Hemoglobin 17.9 (H) 13.0 - 17.0 g/dL   HCT 53.6 (H) 39.0 - 52.0 %   MCV 84.1 78.0 - 100.0 fL   MCH 28.1 26.0 - 34.0 pg   MCHC 33.4 30.0 - 36.0 g/dL   RDW 13.5 11.5 - 15.5 %   Platelets 194 150 - 400 K/uL   Neutrophils Relative % 74 %   Neutro Abs 13.4 (H) 1.7 - 7.7 K/uL   Lymphocytes Relative 19 %   Lymphs Abs 3.4 0.7 - 4.0 K/uL   Monocytes Relative 6 %   Monocytes Absolute 1.0 0.1 - 1.0 K/uL   Eosinophils Relative 1 %   Eosinophils Absolute 0.2 0.0 - 0.7 K/uL   Basophils Relative 0 %   Basophils Absolute 0.0 0.0 - 0.1 K/uL  Comprehensive metabolic panel     Status: Abnormal   Collection Time: 04/27/17 10:43 AM  Result Value Ref Range   Sodium 139 135 - 145 mmol/L   Potassium 3.2 (L) 3.5 - 5.1 mmol/L   Chloride 102 101 - 111 mmol/L   CO2 27 22 - 32 mmol/L   Glucose, Bld 136 (H) 65 - 99 mg/dL   BUN 14 6 - 20 mg/dL   Creatinine, Ser 1.25 (H) 0.61 - 1.24  mg/dL   Calcium 9.2 8.9 - 10.3 mg/dL   Total Protein 7.2 6.5 - 8.1 g/dL   Albumin 3.9 3.5 - 5.0 g/dL   AST 25 15 - 41 U/L   ALT 50 17 - 63 U/L   Alkaline Phosphatase 91 38 - 126 U/L   Total Bilirubin 0.8 0.3 - 1.2 mg/dL   GFR calc non Af Amer >60 >60 mL/min   GFR calc Af Amer >60 >60 mL/min    Comment: (NOTE) The eGFR has been calculated using the CKD EPI equation. This calculation has not been validated in all clinical situations. eGFR's persistently <60 mL/min signify possible Chronic Kidney Disease.    Anion gap 10 5 - 15  Sedimentation rate     Status: None   Collection Time: 04/27/17 10:43 AM  Result Value Ref Range   Sed Rate 3 0 - 16 mm/hr  C-reactive protein     Status: Abnormal   Collection Time: 04/27/17 10:43 AM  Result Value Ref Range   CRP 1.4 (H) <1.0 mg/dL    Comment: Performed at [facility] [address], [place], [place] [address]  I-Stat CG4 Lactic Acid, ED     Status: Abnormal   Collection Time: 04/27/17 10:48  AM  Result Value Ref Range   Lactic Acid, Venous 2.13 (HH) 0.5 - 1.9 mmol/L   Comment NOTIFIED PHYSICIAN    Ct Abdomen Pelvis W Contrast  Result Date: 04/27/2017 CLINICAL DATA:  Left lower back pain radiating down left leg. Generalized abdominal discomfort. EXAM: CT ABDOMEN AND PELVIS WITH CONTRAST TECHNIQUE: Multidetector CT imaging of the abdomen and pelvis was performed using the standard protocol following bolus administration of intravenous contrast. CONTRAST:  168m ISOVUE-300 IOPAMIDOL (ISOVUE-300) INJECTION 61% COMPARISON:  11/13/2014 FINDINGS: Lower chest:  Unremarkable Hepatobiliary: The liver shows diffusely decreased attenuation suggesting steatosis. Gallbladder surgically absent. No intrahepatic or extrahepatic biliary dilation. Pancreas: No focal mass lesion. No dilatation of the main duct. No intraparenchymal cyst. No peripancreatic edema. Spleen: No splenomegaly. No focal mass lesion. Adrenals/Urinary Tract: No adrenal nodule or mass. Kidneys unremarkable. No evidence for hydroureter. The urinary bladder appears normal for the degree of distention. Stomach/Bowel: Stomach is nondistended. No gastric wall thickening. No evidence of outlet obstruction. Duodenum is normally positioned as is the ligament of Treitz. No small bowel wall thickening. No small bowel dilatation. The terminal ileum is normal. The appendix is normal. Diverticular changes are noted in the left colon without evidence of diverticulitis. Vascular/Lymphatic: No abdominal aortic aneurysm. No abdominal aortic atherosclerotic calcification. There is no gastrohepatic or hepatoduodenal ligament lymphadenopathy. No intraperitoneal or retroperitoneal lymphadenopathy. No pelvic sidewall lymphadenopathy. Reproductive: The prostate gland and seminal vesicles have normal imaging features. Other: No intraperitoneal free fluid. Musculoskeletal: There is enlargement of the left ileo psoas complex with associated extraperitoneal edema/  inflammation in the left pelvic sidewall. These changes track from about the level of L4-5 down to the left groin region. There is some hyperenhancement in the ileo site was complex anterior to the left hip joint (see image 40 series 5). No definite rim enhancing intramuscular fluid collection by CT. Bone windows reveal no worrisome lytic or sclerotic osseous lesions. IMPRESSION: Enlargement of the left ileo psoas complex along the left pelvic sidewall with hyperenhancement in the muscle anterior to the left hip. No gross intermuscular abscess or hematoma can be identified by CT. Given the patient's history of elevated white count and fever, infectious etiology favored over muscle injury or intermuscular hematoma. No obvious  bony destruction identified in the lower lumbar spine, sacrum, or left bony pelvis. MRI of the pelvis without and with contrast may prove helpful to further evaluate. I discussed these findings by telephone with Dr. I 6 at the time of study interpretation. Electronically Signed   By: Misty Stanley M.D.   On: 04/27/2017 12:31    Pending Labs Unresulted Labs    Start     Ordered   04/27/17 1005  Blood culture (routine x 2)  BLOOD CULTURE X 2,   STAT     04/27/17 1004      Vitals/Pain Today's Vitals   04/27/17 1230 04/27/17 1244 04/27/17 1300 04/27/17 1344  BP: 119/74 110/85 112/84 105/80  Pulse: (!) 125 (!) 125 (!) 112 (!) 109  Resp: 13 18 (!) 8 11  Temp:      TempSrc:      SpO2: 92% 96% 92% 93%  Weight:      Height:      PainSc:        Isolation Precautions No active isolations  Medications Medications  lactated ringers bolus 1,000 mL (not administered)  potassium chloride SA (K-DUR,KLOR-CON) CR tablet 40 mEq (not administered)  sodium chloride 0.9 % bolus 1,000 mL (0 mLs Intravenous Stopped 04/27/17 1339)  HYDROmorphone (DILAUDID) injection 1 mg (1 mg Intravenous Given 04/27/17 1059)  ondansetron (ZOFRAN) injection 4 mg (4 mg Intravenous Given 04/27/17 1058)   HYDROmorphone (DILAUDID) injection 1 mg (1 mg Intravenous Given 04/27/17 1244)  iopamidol (ISOVUE-300) 61 % injection (100 mLs  Contrast Given 04/27/17 1144)  lactated ringers bolus 1,000 mL (0 mLs Intravenous Stopped 04/27/17 1350)  vancomycin (VANCOCIN) IVPB 1000 mg/200 mL premix (0 mg Intravenous Stopped 04/27/17 1350)  ceFEPIme (MAXIPIME) 2 g in dextrose 5 % 50 mL IVPB (0 g Intravenous Stopped 04/27/17 1340)  acetaminophen (TYLENOL) tablet 1,000 mg (1,000 mg Oral Given 04/27/17 1347)  gadobenate dimeglumine (MULTIHANCE) injection 20 mL (20 mLs Intravenous Contrast Given 04/27/17 1442)    Mobility walks with person assist

## 2017-04-27 NOTE — ED Triage Notes (Addendum)
Pt complaint of continued left lower back pain radiating down left leg for 16 days; recently diagnosed with sciatica. Unrelated to triage pt heartrate on monitor 140; Isaacs MD made aware,verbal order for EKG placed, and MD en route to see pt.

## 2017-04-27 NOTE — ED Notes (Signed)
Isaacs MD at bedside °

## 2017-04-27 NOTE — ED Notes (Signed)
Patient transported to MRI 

## 2017-04-27 NOTE — Consult Note (Signed)
I have reviewed images and discussed the case with primary team and Radiology and Dr. Doreatha Martin.   [facility] for [facility] placed drain and possible I&Collins if needed  Formal consult to follow   Bryan Collins

## 2017-04-27 NOTE — Progress Notes (Signed)
Pharmacy Antibiotic Note  Bryan Collins is a 51 y.o. male admitted on 04/27/2017 with psoas abscess.  Pharmacy has been consulted for Vancomycin and Cefepime dosing. First doses of antibiotics given in the ED.   Plan: Vancomycin 750mg  IV q12h-start now, as patient did not receive adequate loading dose in ED. Plan for Vancomycin trough level at steady state.  Cefepime 2g IV q8h.  Monitor renal function, cultures, clinical course.   Height: 5\' 10"  (177.8 cm) Weight: 264 lb 1.8 oz (119.8 kg) IBW/kg (Calculated) : 73  Temp (24hrs), Avg:97.8 F (36.6 C), Min:97.5 F (36.4 C), Max:98 F (36.7 C)   Recent Labs Lab 04/27/17 1043 04/27/17 1048 04/27/17 1509  WBC 16.6*  --   --   CREATININE 1.25*  --   --   LATICACIDVEN  --  2.13* 0.91    Estimated Creatinine Clearance: 89.7 mL/min (A) (by C-G formula based on SCr of 1.25 mg/dL (H)).    Allergies  Allergen Reactions  . Penicillins Hives    Pt's identical twin had reaction    Antimicrobials this admission: 9/4 >> Vancomycin >> 9/4 >> Cefepime >>  Dose adjustments this admission: --  Microbiology results: 9/4 BCx: sent   Thank you for allowing pharmacy to be a part of this patient's care.   Lindell Spar, PharmD, BCPS Pager: [phone] 04/27/2017 5:20 PM

## 2017-04-27 NOTE — ED Notes (Signed)
Bed: WA02 Expected date:  Expected time:  Means of arrival:  Comments: 

## 2017-04-27 NOTE — Progress Notes (Signed)
A consult was received from an ED physician for vanc/cefepime per pharmacy dosing.  The patient's profile has been reviewed for ht/wt/allergies/indication/available labs.   A one time order has been placed for vanc 1g and cefepime 2g.  Further antibiotics/pharmacy consults should be ordered by admitting physician if indicated.                       Thank you,  Adrian Saran, PharmD, BCPS Pager [phone] 04/27/2017 12:33 PM

## 2017-04-27 NOTE — ED Notes (Signed)
Call report to Katie at [phone] at [phone]

## 2017-04-27 NOTE — ED Notes (Signed)
Bed: WTR5 Expected date:  Expected time:  Means of arrival:  Comments: 

## 2017-04-27 NOTE — H&P (Signed)
History and Physical:    Bryan Collins   BPZ:[id number] DOB: 04/23/1966 DOA: 04/27/2017  Referring MD/provider: Dr. Ellender Hose PCP: Smiley Houseman, MD   Patient coming from: Home  Chief Complaint: Sweating with bowel movement and left leg pain  History of Present Illness:   Bryan Collins is an 51 y.o. male with history of diverticular disease with last known diverticulitis in 2015 presents with subacute onset of left back pain with radiation down the left thigh. Patient also complains of difficulty with bowel movements in that he has sudden onset of sweating and feeling faint when he moves his bowels. This is been going on and off for the past several weeks. Patient denies any fevers or chills. Patient denies malaise. He does admit to diarrhea intermittently.  Patient denies any IV drug use. Patient denies being on prednisone or any other immunosuppressive medications. Patient denies a history of diabetes.  ED Course:  The patient was evaluated in the ED and CT scan was notable for infection along the left so asked. He was started on cefepime and vancomycin and admitted for further workup.  ROS:   ROS 10 point review of systems otherwise negative unless as mentioned in history of present illness.  Past Medical History:   Past Medical History:  Diagnosis Date  . Anxiety   . Asthma   . Barrett's esophagus   . Depression   . Diverticulitis   . Gastritis   . GERD (gastroesophageal reflux disease)   . Hiatal hernia   . HTN (hypertension)     Past Surgical History:   Past Surgical History:  Procedure Laterality Date  . CHOLECYSTECTOMY      Social History:   Social History   Social History  . Marital status: Single    Spouse name: N/A  . Number of children: 0  . Years of education: N/A   Occupational History  . disabled    Social History Main Topics  . Smoking status: Current Every Day Smoker    Packs/day: 1.00    Years: 30.00    Types: Cigarettes   Start date: 01/22/1985  . Smokeless tobacco: Never Used  . Alcohol use 0.0 oz/week     Comment: occasional  . Drug use: Yes     Comment: occasionally  . Sexual activity: Not Currently   Other Topics Concern  . Not on file   Social History Narrative  . No narrative on file    Allergies   Penicillins  Family history:   Family History  Problem Relation Age of Onset  . Breast cancer Mother   . Asthma Mother   . Gallstones Mother   . Heart attack Father   . Diabetes Father   . Prostate cancer Father   . Gallbladder disease Maternal Grandmother   . Stroke Maternal Grandmother   . Colon cancer Neg Hx   . Esophageal cancer Neg Hx   . Pancreatic cancer Neg Hx   . Rectal cancer Neg Hx   . Stomach cancer Neg Hx     Current Medications:   Prior to Admission medications   Medication Sig Start Date End Date Taking? Authorizing Provider  ADVAIR DISKUS 250-50 MCG/DOSE AEPB INHALE 1 PUFF EVERY DAY 01/13/17  Yes Gottschalk, Ashly M, DO  albuterol (PROVENTIL HFA;VENTOLIN HFA) 108 (90 BASE) MCG/ACT inhaler Inhale 2 puffs into the lungs every 6 (six) hours as needed for wheezing or shortness of breath. 01/25/15  Yes Vivi Barrack, MD  amLODipine (  NORVASC) 5 MG tablet Take 1 tablet (5 mg total) by mouth daily. 06/25/16  Yes Vivi Barrack, MD  bismuth subsalicylate (PEPTO BISMOL) 262 MG/15ML suspension Take 30 mLs by mouth every 6 (six) hours as needed for indigestion.   Yes [provider]  clonazePAM (KLONOPIN) 0.5 MG tablet Take 1 tablet (0.5 mg total) by mouth every 6 (six) hours as needed for anxiety. Patient taking differently: Take 1 mg by mouth daily.  03/16/17  Yes Smiley Houseman, MD  cyclobenzaprine (FLEXERIL) 10 MG tablet Take 1 tablet (10 mg total) by mouth 2 (two) times daily as needed for muscle spasms. 04/19/17  Yes Neese, Hope M, NP  ibuprofen (ADVIL,MOTRIN) 200 MG tablet Take 400-600 mg by mouth every 6 (six) hours as needed for moderate pain.   Yes [provider]  naproxen sodium (ANAPROX) 220 MG tablet Take 440 mg by mouth 2 (two) times daily with a meal.   Yes [provider]  omeprazole (PRILOSEC) 40 MG capsule Take 1 capsule (40 mg total) by mouth 2 (two) times daily before lunch and supper. 02/10/17  Yes Nandigam, Venia Minks, MD  sertraline (ZOLOFT) 100 MG tablet Take 2 tablets (200 mg total) by mouth daily. 12/22/16  Yes Vivi Barrack, MD  predniSONE (DELTASONE) 10 MG tablet Take 2 tablets (20 mg total) by mouth 2 (two) times daily with a meal. Patient not taking: Reported on 04/27/2017 04/19/17   Ashley Murrain, NP    Physical Exam:   Vitals:   04/27/17 1502 04/27/17 1517 04/27/17 1530 04/27/17 1639  BP: 116/86 104/83 112/72 140/82  Pulse: (!) 105 97 97 97  Resp:  12 17 18   Temp:    98 F (36.7 C)  TempSrc:    Oral  SpO2: 93% 95% 93% 99%  Weight:    119.8 kg (264 lb 1.8 oz)  Height:    5\' 10"  (1.778 m)     Physical Exam: Blood pressure 140/82, pulse 97, temperature 98 F (36.7 C), temperature source Oral, resp. rate 18, height 5\' 10"  (1.778 m), weight 119.8 kg (264 lb 1.8 oz), SpO2 99 %. Gen: Pleasant obese gentleman lying in bed in no distress. Eyes: Sclerae anicteric. Conjunctiva mildly injected. Neck: Supple, no jugular venous distention. Chest: Moderately good air entry bilaterally with no adventitious sounds.  CV: Distant, regular, no audible murmurs. Abdomen: Obese, NABS, soft, nondistended, nontender. No rebound, no guarding. Extremities: No edema.  Skin: Warm and dry. No rashes, lesions or wounds. Neuro: Alert and oriented times 3; grossly nonfocal. Psych: Patient is cooperative, logical and coherent with appropriate mood and affect.  Data Review:    Labs: Basic Metabolic Panel:  Recent Labs Lab 04/27/17 1043  NA 139  K 3.2*  CL 102  CO2 27  GLUCOSE 136*  BUN 14  CREATININE 1.25*  CALCIUM 9.2   Liver Function Tests:  Recent Labs Lab 04/27/17 1043  AST 25  ALT 50  ALKPHOS 91    BILITOT 0.8  PROT 7.2  ALBUMIN 3.9   No results for input(s): LIPASE, AMYLASE in the last 168 hours. No results for input(s): AMMONIA in the last 168 hours. CBC:  Recent Labs Lab 04/27/17 1043  WBC 16.6*  NEUTROABS 13.4*  HGB 17.9*  HCT 53.6*  MCV 84.1  PLT 194   Cardiac Enzymes: No results for input(s): CKTOTAL, CKMB, CKMBINDEX, TROPONINI in the last 168 hours.  BNP (last 3 results) No results for input(s): PROBNP in the last 8760  hours. CBG: No results for input(s): GLUCAP in the last 168 hours.  Urinalysis    Component Value Date/Time   COLORURINE AMBER (A) 11/13/2014 1707   APPEARANCEUR CLEAR 11/13/2014 1707   LABSPEC 1.029 11/13/2014 1707   PHURINE 5.5 11/13/2014 1707   GLUCOSEU NEGATIVE 11/13/2014 1707   HGBUR NEGATIVE 11/13/2014 1707   BILIRUBINUR SMALL (A) 11/13/2014 1707   KETONESUR NEGATIVE 11/13/2014 1707   PROTEINUR NEGATIVE 11/13/2014 1707   UROBILINOGEN 1.0 11/13/2014 1707   NITRITE NEGATIVE 11/13/2014 1707   LEUKOCYTESUR NEGATIVE 11/13/2014 1707      Radiographic Studies: Mr Pelvis W Wo Contrast  Result Date: 04/27/2017 CLINICAL DATA:  Left psoas muscle infection. EXAM: MRI PELVIS WITHOUT AND WITH CONTRAST TECHNIQUE: Multiplanar multisequence MR imaging of the pelvis was performed both before and after administration of intravenous contrast. CONTRAST:  56mL MULTIHANCE GADOBENATE DIMEGLUMINE 529 MG/ML IV SOLN COMPARISON:  CT abdomen pelvis from same date. FINDINGS: Bones: No suspicious marrow signal abnormality. No fracture or dislocation. Joint/cartilage: No hip joint effusions. Probable tearing of the left anterior superior labrum Ligaments: Not applicable for exam/indication. Tendons/muscles: There is prominent edema involving the left iliopsoas muscles extending inferiorly from L4 along the iliopsoas tendon into the proximal thigh. Edema is also seen in the pectineus and adductor longus and brevis muscles. There is prominent fascial fluid anterior to  the left iliopsoas muscles, extending inferiorly along the iliopsoas tendon. A small amount of fluid is seen surrounding the femoral nerve and vessels. Within the distal left iliopsoas muscle, there is a 1.1 x 1.6 x 6.8 cm (AP by transverse by CC) T2 hypointense rim enhancing fluid collection with heterogeneous T1 signal, including a few areas of T1 hyperintensity. The bilateral iliopsoas, gluteal, and hamstring tendons are intact. Soft tissues: The visualized intrapelvic soft tissues demonstrate sigmoid diverticulosis. IMPRESSION: 1. Prominent intra- and intermuscular fluid involving the left iliopsoas muscles extending into the proximal thigh, concerning for infectious myofasciitis given the elevated white blood cell count and history of fever. 2. Within the distal left iliopsoas muscle, there is a 1.1 x 1.6 x 6.8 cm fluid collection that is relatively hypointense on T2 weighted imaging and has areas of increased T1 signal, possibly representing a hematoma. Superimposed infection is likely given the prominent surrounding inflammatory changes in the left iliopsoas muscle. 3. No evidence of osteomyelitis or left hip joint effusion. Electronically Signed   By: Titus Dubin M.D.   On: 04/27/2017 15:34   Ct Abdomen Pelvis W Contrast  Result Date: 04/27/2017 CLINICAL DATA:  Left lower back pain radiating down left leg. Generalized abdominal discomfort. EXAM: CT ABDOMEN AND PELVIS WITH CONTRAST TECHNIQUE: Multidetector CT imaging of the abdomen and pelvis was performed using the standard protocol following bolus administration of intravenous contrast. CONTRAST:  166mL ISOVUE-300 IOPAMIDOL (ISOVUE-300) INJECTION 61% COMPARISON:  11/13/2014 FINDINGS: Lower chest:  Unremarkable Hepatobiliary: The liver shows diffusely decreased attenuation suggesting steatosis. Gallbladder surgically absent. No intrahepatic or extrahepatic biliary dilation. Pancreas: No focal mass lesion. No dilatation of the main duct. No  intraparenchymal cyst. No peripancreatic edema. Spleen: No splenomegaly. No focal mass lesion. Adrenals/Urinary Tract: No adrenal nodule or mass. Kidneys unremarkable. No evidence for hydroureter. The urinary bladder appears normal for the degree of distention. Stomach/Bowel: Stomach is nondistended. No gastric wall thickening. No evidence of outlet obstruction. Duodenum is normally positioned as is the ligament of Treitz. No small bowel wall thickening. No small bowel dilatation. The terminal ileum is normal. The appendix is normal. Diverticular changes are noted in  the left colon without evidence of diverticulitis. Vascular/Lymphatic: No abdominal aortic aneurysm. No abdominal aortic atherosclerotic calcification. There is no gastrohepatic or hepatoduodenal ligament lymphadenopathy. No intraperitoneal or retroperitoneal lymphadenopathy. No pelvic sidewall lymphadenopathy. Reproductive: The prostate gland and seminal vesicles have normal imaging features. Other: No intraperitoneal free fluid. Musculoskeletal: There is enlargement of the left ileo psoas complex with associated extraperitoneal edema/ inflammation in the left pelvic sidewall. These changes track from about the level of L4-5 down to the left groin region. There is some hyperenhancement in the ileo site was complex anterior to the left hip joint (see image 40 series 5). No definite rim enhancing intramuscular fluid collection by CT. Bone windows reveal no worrisome lytic or sclerotic osseous lesions. IMPRESSION: Enlargement of the left ileo psoas complex along the left pelvic sidewall with hyperenhancement in the muscle anterior to the left hip. No gross intermuscular abscess or hematoma can be identified by CT. Given the patient's history of elevated white count and fever, infectious etiology favored over muscle injury or intermuscular hematoma. No obvious bony destruction identified in the lower lumbar spine, sacrum, or left bony pelvis. MRI of the  pelvis without and with contrast may prove helpful to further evaluate. I discussed these findings by telephone with Dr. I 6 at the time of study interpretation. Electronically Signed   By: Misty Stanley M.D.   On: 04/27/2017 12:31    EKG: Independently reviewed.  Sinus tach at 140, wandering baseline, Q wave V1 and V2, no acute changes noted.  Assessment/Plan:   Principal Problem:   Psoas abscess (HCC) Active Problems:   GAD (generalized anxiety disorder)   Essential hypertension   PSOAS ABSCESS 7 cm fluid collection in the left iliopsoas seen on MRI without evidence of osteomyelitis. Discussed with general surgery and orthopedics, Davis is for placement of drain by IR tomorrow morning. If unsuccessful, I believe that the patient is to be seen by trauma surgeon  We'll continue cefepime and vancomycin day #1 today Nothing by mouth after midnight  HTN Continue amlodipine   GERD WITH BARRETTS Continue omeprazole  MOOD DISORDER Continue sertraline   Other information:   DVT prophylaxis: Lovenox ordered. Code Status: Full code. Family Communication: Patient's mother was at bedside throughout the encounter  Disposition Plan: Home Consults called: Orthopedics, [place] [facility] and general surgery Admission status: Inpatient   The medical decision making on this patient was of high complexity and the patient is at high risk for clinical deterioration, therefore this is a level 3 visit.    Dewaine Oats Derek Jack [facility] Pager [phone] Cell: [phone]   If 7PM-7AM, please contact night-coverage [email or web address] Password [email or web address] 04/27/2017, 5:27 PM

## 2017-04-27 NOTE — ED Provider Notes (Signed)
[facility] DEPT Provider Note   CSN: [id number] Arrival date & time: 04/27/17  0931     History   Chief Complaint Chief Complaint  Patient presents with  . Leg Pain    HPI Bryan Collins is a 51 y.o. male.  HPI  51 year old male with past medical history as below who presents with back pain. The patient was recently seen one week ago for a traumatic left-sided lower back pain. He was diagnosed with sciatica and sent home. Since then, he has had progressive worsening pain. He also endorses night sweats, fevers, and chills. He has no history of back injuries and denies any recent falls or twisting injuries. No extremity weakness or numbness. Denies any IV drug use. Denies any recent sick contacts. His pain is constant but worse with movement and ambulation. No alleviating factors.  Past Medical History:  Diagnosis Date  . Anxiety   . Asthma   . Barrett's esophagus   . Depression   . Diverticulitis   . Gastritis   . GERD (gastroesophageal reflux disease)   . Hiatal hernia   . HTN (hypertension)     Patient Active Problem List   Diagnosis Date Noted  . 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 History of colonic polyps 02/10/2017  . Barrett's esophagus without dysplasia 02/10/2017  . Essential hypertension 06/25/2016  . IBS (irritable bowel syndrome) 06/16/2016  . Asthma, chronic 01/25/2015  . MDD (major depressive disorder) 01/25/2015  . GAD (generalized anxiety disorder) 01/25/2015  . Tobacco use disorder 01/25/2015  . Sebaceous cyst 01/25/2015  . Epigastric pain 01/25/2015  . Healthcare maintenance 01/25/2015    Past Surgical History:  Procedure Laterality Date  . CHOLECYSTECTOMY         Home Medications    Prior to Admission medications   Medication Sig Start Date End Date Taking? Authorizing Provider  ADVAIR DISKUS 250-50 MCG/DOSE AEPB INHALE 1 PUFF EVERY DAY 01/13/17  Yes Gottschalk, Ashly M, DO  albuterol (PROVENTIL HFA;VENTOLIN HFA) 108 (90 BASE) MCG/ACT  inhaler Inhale 2 puffs into the lungs every 6 (six) hours as needed for wheezing or shortness of breath. 01/25/15  Yes Vivi Barrack, MD  amLODipine (NORVASC) 5 MG tablet Take 1 tablet (5 mg total) by mouth daily. 06/25/16  Yes Vivi Barrack, MD  bismuth subsalicylate (PEPTO BISMOL) 262 MG/15ML suspension Take 30 mLs by mouth every 6 (six) hours as needed for indigestion.   Yes [provider]  clonazePAM (KLONOPIN) 0.5 MG tablet Take 1 tablet (0.5 mg total) by mouth every 6 (six) hours as needed for anxiety. Patient taking differently: Take 1 mg by mouth daily.  03/16/17  Yes Smiley Houseman, MD  cyclobenzaprine (FLEXERIL) 10 MG tablet Take 1 tablet (10 mg total) by mouth 2 (two) times daily as needed for muscle spasms. 04/19/17  Yes Neese, Hope M, NP  ibuprofen (ADVIL,MOTRIN) 200 MG tablet Take 400-600 mg by mouth every 6 (six) hours as needed for moderate pain.   Yes [provider]  naproxen sodium (ANAPROX) 220 MG tablet Take 440 mg by mouth 2 (two) times daily with a meal.   Yes [provider]  omeprazole (PRILOSEC) 40 MG capsule Take 1 capsule (40 mg total) by mouth 2 (two) times daily before lunch and supper. 02/10/17  Yes Nandigam, Venia Minks, MD  sertraline (ZOLOFT) 100 MG tablet Take 2 tablets (200 mg total) by mouth daily. 12/22/16  Yes Vivi Barrack, MD  predniSONE (DELTASONE) 10 MG tablet Take 2  tablets (20 mg total) by mouth 2 (two) times daily with a meal. Patient not taking: Reported on 04/27/2017 04/19/17   Ashley Murrain, NP    Family History Family History  Problem Relation Age of Onset  . Breast cancer Mother   . Asthma Mother   . Gallstones Mother   . Heart attack Father   . Diabetes Father   . Prostate cancer Father   . Gallbladder disease Maternal Grandmother   . Stroke Maternal Grandmother   . Colon cancer Neg Hx   . Esophageal cancer Neg Hx   . Pancreatic cancer Neg Hx   . Rectal cancer Neg Hx   . Stomach cancer Neg Hx     Social  History Social History  Substance Use Topics  . Smoking status: Current Every Day Smoker    Packs/day: 1.00    Years: 30.00    Types: Cigarettes    Start date: 01/22/1985  . Smokeless tobacco: Never Used  . Alcohol use 0.0 oz/week     Comment: occasional     Allergies   Penicillins   Review of Systems Review of Systems  Constitutional: Positive for chills, fatigue and fever.  HENT: Negative for congestion and rhinorrhea.   Eyes: Negative for visual disturbance.  Respiratory: Negative for cough, shortness of breath and wheezing.   Cardiovascular: Negative for chest pain and leg swelling.  Gastrointestinal: Negative for abdominal pain, diarrhea, nausea and vomiting.  Genitourinary: Negative for dysuria and flank pain.  Musculoskeletal: Positive for back pain and gait problem. Negative for joint swelling, neck pain and neck stiffness.  Skin: Negative for rash and wound.  Allergic/Immunologic: Negative for immunocompromised state.  Neurological: Negative for syncope, weakness and headaches.  All other systems reviewed and are negative.    Physical Exam Updated Vital Signs BP 140/82   Pulse 97   Temp 98 F (36.7 C) (Oral)   Resp 18   Ht 5\' 10"  (1.778 m)   Wt 119.8 kg (264 lb 1.8 oz)   SpO2 99%   BMI 37.90 kg/m   Physical Exam  Constitutional: He is oriented to person, place, and time. He appears well-developed and well-nourished. No distress.  HENT:  Head: Normocephalic and atraumatic.  Eyes: Pupils are equal, round, and reactive to light. Conjunctivae are normal.  Neck: Neck supple.  Cardiovascular: Regular rhythm and normal heart sounds.  Tachycardia present.  Exam reveals no friction rub.   No murmur heard. Pulmonary/Chest: Effort normal and breath sounds normal. No respiratory distress. He has no wheezes. He has no rales.  Abdominal: Soft. He exhibits no distension.  Musculoskeletal: He exhibits no edema.  Neurological: He is alert and oriented to person,  place, and time. He exhibits normal muscle tone.  Skin: Skin is warm. Capillary refill takes less than 2 seconds.  Psychiatric: He has a normal mood and affect.  Nursing note and vitals reviewed.   Spine Exam: Inspection/Palpation: Moderate TTP over left paraspinal area, no midline TTP or deformity. Strength: 5/5 throughout LE bilaterally (hip flexion/extension, adduction/abduction; knee flexion/extension; foot dorsiflexion/plantarflexion, inversion/eversion; great toe inversion) Sensation: Intact to light touch in proximal and distal LE bilaterally Reflexes: 2+ quadriceps and achilles reflexes    ED Treatments / Results  Labs (all labs ordered are listed, but only abnormal results are displayed) Labs Reviewed  CBC WITH DIFFERENTIAL/PLATELET - Abnormal; Notable for the following:       Result Value   WBC 16.6 (*)    RBC 6.37 (*)    Hemoglobin  17.9 (*)    HCT 53.6 (*)    Neutro Abs 13.4 (*)    All other components within normal limits  COMPREHENSIVE METABOLIC PANEL - Abnormal; Notable for the following:    Potassium 3.2 (*)    Glucose, Bld 136 (*)    Creatinine, Ser 1.25 (*)    All other components within normal limits  C-REACTIVE PROTEIN - Abnormal; Notable for the following:    CRP 1.4 (*)    All other components within normal limits  I-STAT CG4 LACTIC ACID, ED - Abnormal; Notable for the following:    Lactic Acid, Venous 2.13 (*)    All other components within normal limits  CULTURE, BLOOD (ROUTINE X 2)  CULTURE, BLOOD (ROUTINE X 2)  SEDIMENTATION RATE  PROTIME-INR  COMPREHENSIVE METABOLIC PANEL  I-STAT CG4 LACTIC ACID, ED  TYPE AND SCREEN    EKG  EKG Interpretation None       Radiology Mr Pelvis W Wo Contrast  Result Date: 04/27/2017 CLINICAL DATA:  Left psoas muscle infection. EXAM: MRI PELVIS WITHOUT AND WITH CONTRAST TECHNIQUE: Multiplanar multisequence MR imaging of the pelvis was performed both before and after administration of intravenous contrast.  CONTRAST:  71mL MULTIHANCE GADOBENATE DIMEGLUMINE 529 MG/ML IV SOLN COMPARISON:  CT abdomen pelvis from same date. FINDINGS: Bones: No suspicious marrow signal abnormality. No fracture or dislocation. Joint/cartilage: No hip joint effusions. Probable tearing of the left anterior superior labrum Ligaments: Not applicable for exam/indication. Tendons/muscles: There is prominent edema involving the left iliopsoas muscles extending inferiorly from L4 along the iliopsoas tendon into the proximal thigh. Edema is also seen in the pectineus and adductor longus and brevis muscles. There is prominent fascial fluid anterior to the left iliopsoas muscles, extending inferiorly along the iliopsoas tendon. A small amount of fluid is seen surrounding the femoral nerve and vessels. Within the distal left iliopsoas muscle, there is a 1.1 x 1.6 x 6.8 cm (AP by transverse by CC) T2 hypointense rim enhancing fluid collection with heterogeneous T1 signal, including a few areas of T1 hyperintensity. The bilateral iliopsoas, gluteal, and hamstring tendons are intact. Soft tissues: The visualized intrapelvic soft tissues demonstrate sigmoid diverticulosis. IMPRESSION: 1. Prominent intra- and intermuscular fluid involving the left iliopsoas muscles extending into the proximal thigh, concerning for infectious myofasciitis given the elevated white blood cell count and history of fever. 2. Within the distal left iliopsoas muscle, there is a 1.1 x 1.6 x 6.8 cm fluid collection that is relatively hypointense on T2 weighted imaging and has areas of increased T1 signal, possibly representing a hematoma. Superimposed infection is likely given the prominent surrounding inflammatory changes in the left iliopsoas muscle. 3. No evidence of osteomyelitis or left hip joint effusion. Electronically Signed   By: Titus Dubin M.D.   On: 04/27/2017 15:34   Ct Abdomen Pelvis W Contrast  Result Date: 04/27/2017 CLINICAL DATA:  Left lower back pain  radiating down left leg. Generalized abdominal discomfort. EXAM: CT ABDOMEN AND PELVIS WITH CONTRAST TECHNIQUE: Multidetector CT imaging of the abdomen and pelvis was performed using the standard protocol following bolus administration of intravenous contrast. CONTRAST:  139mL ISOVUE-300 IOPAMIDOL (ISOVUE-300) INJECTION 61% COMPARISON:  11/13/2014 FINDINGS: Lower chest:  Unremarkable Hepatobiliary: The liver shows diffusely decreased attenuation suggesting steatosis. Gallbladder surgically absent. No intrahepatic or extrahepatic biliary dilation. Pancreas: No focal mass lesion. No dilatation of the main duct. No intraparenchymal cyst. No peripancreatic edema. Spleen: No splenomegaly. No focal mass lesion. Adrenals/Urinary Tract: No adrenal nodule or mass. Kidneys  unremarkable. No evidence for hydroureter. The urinary bladder appears normal for the degree of distention. Stomach/Bowel: Stomach is nondistended. No gastric wall thickening. No evidence of outlet obstruction. Duodenum is normally positioned as is the ligament of Treitz. No small bowel wall thickening. No small bowel dilatation. The terminal ileum is normal. The appendix is normal. Diverticular changes are noted in the left colon without evidence of diverticulitis. Vascular/Lymphatic: No abdominal aortic aneurysm. No abdominal aortic atherosclerotic calcification. There is no gastrohepatic or hepatoduodenal ligament lymphadenopathy. No intraperitoneal or retroperitoneal lymphadenopathy. No pelvic sidewall lymphadenopathy. Reproductive: The prostate gland and seminal vesicles have normal imaging features. Other: No intraperitoneal free fluid. Musculoskeletal: There is enlargement of the left ileo psoas complex with associated extraperitoneal edema/ inflammation in the left pelvic sidewall. These changes track from about the level of L4-5 down to the left groin region. There is some hyperenhancement in the ileo site was complex anterior to the left hip joint  (see image 40 series 5). No definite rim enhancing intramuscular fluid collection by CT. Bone windows reveal no worrisome lytic or sclerotic osseous lesions. IMPRESSION: Enlargement of the left ileo psoas complex along the left pelvic sidewall with hyperenhancement in the muscle anterior to the left hip. No gross intermuscular abscess or hematoma can be identified by CT. Given the patient's history of elevated white count and fever, infectious etiology favored over muscle injury or intermuscular hematoma. No obvious bony destruction identified in the lower lumbar spine, sacrum, or left bony pelvis. MRI of the pelvis without and with contrast may prove helpful to further evaluate. I discussed these findings by telephone with Dr. I 6 at the time of study interpretation. Electronically Signed   By: Misty Stanley M.D.   On: 04/27/2017 12:31    Procedures Procedures (including critical care time)  Medications Ordered in ED Medications  clonazePAM (KLONOPIN) tablet 1 mg (1 mg Oral Given 04/27/17 1713)  pantoprazole (PROTONIX) EC tablet 40 mg (40 mg Oral Given 04/27/17 1713)  sertraline (ZOLOFT) tablet 200 mg (200 mg Oral Given 04/27/17 1713)  amLODipine (NORVASC) tablet 5 mg (5 mg Oral Given 04/27/17 1713)  albuterol (PROVENTIL) (2.5 MG/3ML) 0.083% nebulizer solution 3 mL (not administered)  HYDROmorphone HCl (DILAUDID) liquid 1 mg (1 mg Oral Given 04/27/17 1713)  sodium chloride flush (NS) 0.9 % injection 3 mL (not administered)  sodium chloride flush (NS) 0.9 % injection 3 mL (not administered)  0.9 %  sodium chloride infusion (not administered)  vancomycin (VANCOCIN) IVPB 750 mg/150 ml premix (750 mg Intravenous New Bag/Given 04/27/17 1901)  ceFEPIme (MAXIPIME) 2 g in dextrose 5 % 50 mL IVPB (not administered)  sodium chloride 0.9 % bolus 1,000 mL (0 mLs Intravenous Stopped 04/27/17 1339)  HYDROmorphone (DILAUDID) injection 1 mg (1 mg Intravenous Given 04/27/17 1059)  ondansetron (ZOFRAN) injection 4 mg (4 mg  Intravenous Given 04/27/17 1058)  HYDROmorphone (DILAUDID) injection 1 mg (1 mg Intravenous Given 04/27/17 1244)  iopamidol (ISOVUE-300) 61 % injection (100 mLs  Contrast Given 04/27/17 1144)  lactated ringers bolus 1,000 mL (0 mLs Intravenous Stopped 04/27/17 1350)  vancomycin (VANCOCIN) IVPB 1000 mg/200 mL premix (0 mg Intravenous Stopped 04/27/17 1350)  ceFEPIme (MAXIPIME) 2 g in dextrose 5 % 50 mL IVPB (0 g Intravenous Stopped 04/27/17 1340)  acetaminophen (TYLENOL) tablet 1,000 mg (1,000 mg Oral Given 04/27/17 1347)  lactated ringers bolus 1,000 mL (0 mLs Intravenous Stopped 04/27/17 1620)  potassium chloride SA (K-DUR,KLOR-CON) CR tablet 40 mEq (40 mEq Oral Given 04/27/17 1713)  gadobenate dimeglumine (  MULTIHANCE) injection 20 mL (20 mLs Intravenous Contrast Given 04/27/17 1442)     Initial Impression / Assessment and Plan / ED Course  I have reviewed the triage vital signs and the nursing notes.  Pertinent labs & imaging results that were available during my care of the patient were reviewed by me and considered in my medical decision making (see chart for details).     51 year old male with no significant past smoking history one year with atraumatic lower back pain. Given his tachycardia and nighttime chills and sweats, concern for underlying infectious or inflammatory process. Lab work shows leukocytosis and mild lactic acidosis. CT scan subsequently obtained shows concern for possible psoas myositis or abscess. Patient given vancomycin and cefepime due to possible concomitant discitis, will obtain MRI, and admit to medicine.  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

## 2017-04-28 ENCOUNTER — Other Ambulatory Visit: Payer: Self-pay | Admitting: *Deleted

## 2017-04-28 ENCOUNTER — Inpatient Hospital Stay (HOSPITAL_COMMUNITY): Payer: Medicaid Other

## 2017-04-28 ENCOUNTER — Encounter (HOSPITAL_COMMUNITY): Payer: Self-pay | Admitting: General Surgery

## 2017-04-28 LAB — PROTIME-INR
INR: 1.1
Prothrombin Time: 14.1 seconds (ref 11.4–15.2)

## 2017-04-28 MED ORDER — LIDOCAINE-EPINEPHRINE 1 %-1:100000 IJ SOLN
INTRAMUSCULAR | Status: AC
  Filled 2017-04-28: qty 1

## 2017-04-28 MED ORDER — SERTRALINE HCL 100 MG PO TABS: 200.0000 mg | ORAL_TABLET | Freq: Every day | ORAL | 0 refills | Status: DC

## 2017-04-28 MED ORDER — HYDROMORPHONE HCL 1 MG/ML IJ SOLN
0.5000 mg | INTRAMUSCULAR | Status: DC | PRN
  Administered 2017-04-28: 1 mg via INTRAVENOUS
  Filled 2017-04-28: qty 1

## 2017-04-28 MED ORDER — MIDAZOLAM HCL 2 MG/2ML IJ SOLN
INTRAMUSCULAR | Status: AC | PRN
  Administered 2017-04-28 (×2): 1 mg via INTRAVENOUS

## 2017-04-28 MED ORDER — FENTANYL CITRATE (PF) 100 MCG/2ML IJ SOLN
INTRAMUSCULAR | Status: AC
  Filled 2017-04-28: qty 6

## 2017-04-28 MED ORDER — FENTANYL CITRATE (PF) 100 MCG/2ML IJ SOLN
INTRAMUSCULAR | Status: AC | PRN
  Administered 2017-04-28 (×2): 50 ug via INTRAVENOUS

## 2017-04-28 MED ORDER — MIDAZOLAM HCL 2 MG/2ML IJ SOLN
INTRAMUSCULAR | Status: AC
  Filled 2017-04-28: qty 6

## 2017-04-28 MED ORDER — OXYCODONE-ACETAMINOPHEN 5-325 MG PO TABS
1.0000 | ORAL_TABLET | ORAL | Status: DC | PRN
  Administered 2017-04-28 – 2017-04-29 (×6): 2 via ORAL
  Filled 2017-04-28 (×6): qty 2

## 2017-04-28 NOTE — Progress Notes (Signed)
PROGRESS NOTE [facility]   Bryan Collins   KDT:[id number] DOB: 01-02-1966  DOA: 04/27/2017 PCP: Smiley Houseman, MD   Brief Narrative:  Bryan Collins is a 51 year old male with history of diverticular disease and hypertension presented to the emergency department with sudden onset of left back pain with radiation to his left leg.upon ED evaluation CT scan was notable for infection along the left psoas muscle consistent with an abscess. Orthopedic surgery was consulted for possible I&D were recommended intervention by IR. Patient was admitted for IV antibiotics and further workup.  Subjective: Patient seen and examined, complaining of back and leg pain. No other concerns. Denies chest pain, shortness of breath, palpitations and weakness. Patient denies IV drug use, recent travel or sick contacts  Assessment & Plan: Iliopsoas abscess status post CT guided aspiration MRI with evidence of abscess,no osteomyelitis noted. Inflammatory markers mildly elevated Unclear etiology at this time patient denies IV drug use, no immunocompromise. Will continue antibiotics and await for abcess cultures Blood cultures thus far negative  Check HIV and QuantiFERON Orthopedic surgery and IR input appreciated Pain management as needed  Hypertension Blood pressure stable Continue amlodipine  GERD Continue omeprazole  Depression No SI/HI Continue home medications sertraline and Klonopin  Hypokalemia Replete Check BMP in the morning Check mag  DVT prophylaxis: Lovenox Code Status: full code Family Communication: none at bedside Disposition Plan: home when medically stable   Consultants:   Orthopedic surgery  Interventional cardiology  Procedures:   CT-guided aspiration of iliopsoas abscess 04/28/2017  Antimicrobials: Anti-infectives    Start     Dose/Rate Route Frequency Ordered Stop   04/27/17 2100  ceFEPIme (MAXIPIME) 2 g in dextrose 5 % 50 mL IVPB     2 g 100 mL/hr  over 30 Minutes Intravenous Every 8 hours 04/27/17 1718     04/27/17 1800  vancomycin (VANCOCIN) IVPB 750 mg/150 ml premix     750 mg 150 mL/hr over 60 Minutes Intravenous Every 12 hours 04/27/17 1718     04/27/17 1245  vancomycin (VANCOCIN) IVPB 1000 mg/200 mL premix     1,000 mg 200 mL/hr over 60 Minutes Intravenous  Once 04/27/17 1232 04/27/17 1350   04/27/17 1245  ceFEPIme (MAXIPIME) 2 g in dextrose 5 % 50 mL IVPB     2 g 100 mL/hr over 30 Minutes Intravenous  Once 04/27/17 1232 04/27/17 1340       Objective: Vitals:   04/28/17 1435 04/28/17 1440 04/28/17 1445 04/28/17 1553  BP: 113/72 110/72 117/77 (!) 146/85  Pulse: 87 85 88 91  Resp: 12 11 10 18   Temp:    98 F (36.7 C)  TempSrc:      SpO2: 99% 97% 98% 95%  Weight:      Height:        Intake/Output Summary (Last 24 hours) at 04/28/17 1727 Last data filed at 04/28/17 0900  Gross per 24 hour  Intake              320 ml  Output              400 ml  Net              -80 ml   Filed Weights   04/27/17 0951 04/27/17 1639 04/27/17 2259  Weight: 124.7 kg (275 lb) 119.8 kg (264 lb 1.8 oz) 118.7 kg (261 lb 11.2 oz)    Examination:  General exam: Appears calm and comfortable  HEENT: AC/AT, PERRLA, OP moist  and clear Respiratory system: Clear to auscultation. No wheezes,crackle or rhonchi Cardiovascular system: S1 & S2 heard, RRR. No JVD, murmurs, rubs or gallops Gastrointestinal system: Abdomen is nondistended, soft and nontenderness Central nervous system: Alert and oriented. No focal neurological deficits. Extremities: No pedal edema. Left lower extremity painful with range of motion, tender to palpation over anterior left hip, sensations intact Skin: No rashes, lesions or ulcers Psychiatry: Judgement and insight appear normal. Mood & affect appropriate.   Data Reviewed: I have personally reviewed following labs and imaging studies  CBC:  Recent Labs Lab 04/27/17 1043  WBC 16.6*  NEUTROABS 13.4*  HGB 17.9*    HCT 53.6*  MCV 84.1  PLT 532   Basic Metabolic Panel:  Recent Labs Lab 04/27/17 1043  NA 139  K 3.2*  CL 102  CO2 27  GLUCOSE 136*  BUN 14  CREATININE 1.25*  CALCIUM 9.2   GFR: Estimated Creatinine Clearance: 89.3 mL/min (A) (by C-G formula based on SCr of 1.25 mg/dL (H)). Liver Function Tests:  Recent Labs Lab 04/27/17 1043  AST 25  ALT 50  ALKPHOS 91  BILITOT 0.8  PROT 7.2  ALBUMIN 3.9   No results for input(s): LIPASE, AMYLASE in the last 168 hours. No results for input(s): AMMONIA in the last 168 hours. Coagulation Profile:  Recent Labs Lab 04/28/17 1020  INR 1.10   Cardiac Enzymes: No results for input(s): CKTOTAL, CKMB, CKMBINDEX, TROPONINI in the last 168 hours. BNP (last 3 results) No results for input(s): PROBNP in the last 8760 hours. HbA1C: No results for input(s): HGBA1C in the last 72 hours. CBG: No results for input(s): GLUCAP in the last 168 hours. Lipid Profile: No results for input(s): CHOL, HDL, LDLCALC, TRIG, CHOLHDL, LDLDIRECT in the last 72 hours. Thyroid Function Tests: No results for input(s): TSH, T4TOTAL, FREET4, T3FREE, THYROIDAB in the last 72 hours. Anemia Panel: No results for input(s): VITAMINB12, FOLATE, FERRITIN, TIBC, IRON, RETICCTPCT in the last 72 hours. Sepsis Labs:  Recent Labs Lab 04/27/17 1048 04/27/17 1509  LATICACIDVEN 2.13* 0.91    Recent Results (from the past 240 hour(s))  Blood culture (routine x 2)     Status: None (Preliminary result)   Collection Time: 04/27/17 10:43 AM  Result Value Ref Range Status   Specimen Description BLOOD RIGHT ANTECUBITAL  Final   Special Requests   Final    BOTTLES DRAWN AEROBIC AND ANAEROBIC Blood Culture adequate volume   Culture   Final    NO GROWTH < 24 HOURS Performed at [facility], [place] [address], [place], [place] [address]    Report Status PENDING  Incomplete  Blood culture (routine x 2)     Status: None (Preliminary result)   Collection Time:  04/27/17 10:57 AM  Result Value Ref Range Status   Specimen Description BLOOD LEFT ANTECUBITAL  Final   Special Requests   Final    BOTTLES DRAWN AEROBIC AND ANAEROBIC Blood Culture adequate volume   Culture   Final    NO GROWTH < 24 HOURS Performed at [facility], [place] [address], [place], [place] [address]    Report Status PENDING  Incomplete      Radiology Studies: Mr Pelvis W Wo Contrast  Result Date: 04/27/2017 CLINICAL DATA:  Left psoas muscle infection. EXAM: MRI PELVIS WITHOUT AND WITH CONTRAST TECHNIQUE: Multiplanar multisequence MR imaging of the pelvis was performed both before and after administration of intravenous contrast. CONTRAST:  53mL MULTIHANCE GADOBENATE DIMEGLUMINE 529 MG/ML  IV SOLN COMPARISON:  CT abdomen pelvis from same date. FINDINGS: Bones: No suspicious marrow signal abnormality. No fracture or dislocation. Joint/cartilage: No hip joint effusions. Probable tearing of the left anterior superior labrum Ligaments: Not applicable for exam/indication. Tendons/muscles: There is prominent edema involving the left iliopsoas muscles extending inferiorly from L4 along the iliopsoas tendon into the proximal thigh. Edema is also seen in the pectineus and adductor longus and brevis muscles. There is prominent fascial fluid anterior to the left iliopsoas muscles, extending inferiorly along the iliopsoas tendon. A small amount of fluid is seen surrounding the femoral nerve and vessels. Within the distal left iliopsoas muscle, there is a 1.1 x 1.6 x 6.8 cm (AP by transverse by CC) T2 hypointense rim enhancing fluid collection with heterogeneous T1 signal, including a few areas of T1 hyperintensity. The bilateral iliopsoas, gluteal, and hamstring tendons are intact. Soft tissues: The visualized intrapelvic soft tissues demonstrate sigmoid diverticulosis. IMPRESSION: 1. Prominent intra- and intermuscular fluid involving the left iliopsoas muscles extending into the proximal thigh,  concerning for infectious myofasciitis given the elevated white blood cell count and history of fever. 2. Within the distal left iliopsoas muscle, there is a 1.1 x 1.6 x 6.8 cm fluid collection that is relatively hypointense on T2 weighted imaging and has areas of increased T1 signal, possibly representing a hematoma. Superimposed infection is likely given the prominent surrounding inflammatory changes in the left iliopsoas muscle. 3. No evidence of osteomyelitis or left hip joint effusion. Electronically Signed   By: Titus Dubin M.D.   On: 04/27/2017 15:34   Ct Abdomen Pelvis W Contrast  Result Date: 04/27/2017 CLINICAL DATA:  Left lower back pain radiating down left leg. Generalized abdominal discomfort. EXAM: CT ABDOMEN AND PELVIS WITH CONTRAST TECHNIQUE: Multidetector CT imaging of the abdomen and pelvis was performed using the standard protocol following bolus administration of intravenous contrast. CONTRAST:  154mL ISOVUE-300 IOPAMIDOL (ISOVUE-300) INJECTION 61% COMPARISON:  11/13/2014 FINDINGS: Lower chest:  Unremarkable Hepatobiliary: The liver shows diffusely decreased attenuation suggesting steatosis. Gallbladder surgically absent. No intrahepatic or extrahepatic biliary dilation. Pancreas: No focal mass lesion. No dilatation of the main duct. No intraparenchymal cyst. No peripancreatic edema. Spleen: No splenomegaly. No focal mass lesion. Adrenals/Urinary Tract: No adrenal nodule or mass. Kidneys unremarkable. No evidence for hydroureter. The urinary bladder appears normal for the degree of distention. Stomach/Bowel: Stomach is nondistended. No gastric wall thickening. No evidence of outlet obstruction. Duodenum is normally positioned as is the ligament of Treitz. No small bowel wall thickening. No small bowel dilatation. The terminal ileum is normal. The appendix is normal. Diverticular changes are noted in the left colon without evidence of diverticulitis. Vascular/Lymphatic: No abdominal aortic  aneurysm. No abdominal aortic atherosclerotic calcification. There is no gastrohepatic or hepatoduodenal ligament lymphadenopathy. No intraperitoneal or retroperitoneal lymphadenopathy. No pelvic sidewall lymphadenopathy. Reproductive: The prostate gland and seminal vesicles have normal imaging features. Other: No intraperitoneal free fluid. Musculoskeletal: There is enlargement of the left ileo psoas complex with associated extraperitoneal edema/ inflammation in the left pelvic sidewall. These changes track from about the level of L4-5 down to the left groin region. There is some hyperenhancement in the ileo site was complex anterior to the left hip joint (see image 40 series 5). No definite rim enhancing intramuscular fluid collection by CT. Bone windows reveal no worrisome lytic or sclerotic osseous lesions. IMPRESSION: Enlargement of the left ileo psoas complex along the left pelvic sidewall with hyperenhancement in the muscle anterior to the left hip. No gross  intermuscular abscess or hematoma can be identified by CT. Given the patient's history of elevated white count and fever, infectious etiology favored over muscle injury or intermuscular hematoma. No obvious bony destruction identified in the lower lumbar spine, sacrum, or left bony pelvis. MRI of the pelvis without and with contrast may prove helpful to further evaluate. I discussed these findings by telephone with Dr. I 6 at the time of study interpretation. Electronically Signed   By: Misty Stanley M.D.   On: 04/27/2017 12:31   Ct Aspiration  Result Date: 04/28/2017 INDICATION: Findings worrisome for infectious mouth fasciitis involving the left iliopsoas musculature, with indeterminate fluid collection within the left iliopsoas musculature. Please perform ultrasound / CT-guided aspiration for tissue diagnostic purposes. EXAM: CT GUIDANCE NEEDLE PLACEMENT COMPARISON:  Pelvic MRI - 04/27/2017; CT abdomen and pelvis - 04/28/2027 MEDICATIONS: The  patient is currently admitted to the hospital and receiving intravenous antibiotics. The antibiotics were administered within an appropriate time frame prior to the initiation of the procedure. ANESTHESIA/SEDATION: Moderate (conscious) sedation was employed during this procedure. A total of Versed 2 mg and Fentanyl 100 mcg was administered intravenously. Moderate Sedation Time: 13 minutes. The patient's level of consciousness and vital signs were monitored continuously by radiology nursing throughout the procedure under my direct supervision. CONTRAST:  None COMPLICATIONS: None immediate. PROCEDURE: Informed written consent was obtained from the patient after a discussion of the risks, benefits and alternatives to treatment. The patient was placed supine on the CT gantry and a pre procedural CT was performed re-demonstrating the known abscess/fluid collection within the distal aspect of the left iliopsoas musculature with ill-defined component measuring approximately 3.5 x 2.1 cm (image 29, series 2). The procedure was planned. A timeout was performed prior to the initiation of the procedure. The skin overlying the left groin was prepped and draped in usual sterile fashion. Under direct ultrasound guidance, ill-defined fluid collection within the distal aspect of the left iliopsoas musculature was accessed with a 22 gauge spinal needle after the overlying soft tissues were anesthetized with 1% lidocaine with epinephrine. Appropriate position was confirmed with CT imaging. Next, under direct ultrasound guidance, an 18 gauge trocar needle was advanced into the ill-defined fluid collection with the left iliopsoas musculature. Multiple ultrasound images were saved for procedural documentation purposes. Appropriate position was confirmed with CT imaging. Despite appropriate needle positioning, only approximately 1 cc of bloody fluid was able to be aspirated from the indeterminate fluid collection. All aspirated fluid was  capped and sent to the laboratory for analysis. A dressing was placed. The patient tolerated the procedure well without immediate post procedural complication. IMPRESSION: Technically successful ultrasound and CT guided aspiration of approximately 1 cc of bloody fluid from indeterminate fluid collection within the distal aspect of the left iliopsoas musculature, favored to represent a hematoma. The aspirated bloody fluid was capped and sent to the laboratory for analysis. Electronically Signed   By: Sandi Mariscal M.D.   On: 04/28/2017 15:40   Scheduled Meds: . amLODipine  5 mg Oral Daily  . clonazePAM  1 mg Oral Daily  . lidocaine-EPINEPHrine      . midazolam      . pantoprazole  40 mg Oral Daily  . sertraline  200 mg Oral Daily  . sodium chloride flush  3 mL Intravenous Q12H   Continuous Infusions: . sodium chloride    . ceFEPime (MAXIPIME) IV Stopped (04/28/17 1732)  . vancomycin Stopped (04/28/17 0754)     LOS: 1 day  Time spent: Total of 25 minutes spent with pt, greater than 50% of which was spent in discussion of  treatment, counseling and coordination of care    Chipper Oman, MD Pager: Text Page via [email or web address]   If 7PM-7AM, please contact night-coverage [email or web address] 04/28/2017, 5:27 PM

## 2017-04-28 NOTE — Consult Note (Signed)
Chief Complaint: low back pain and left leg pain  Referring Physician:Dr. Bonnell Public  Supervising Physician: Sandi Mariscal  Patient Status: [facility] - In-pt  HPI: Bryan Collins is a 51 y.o. male who began having low back pain about 17 days ago.  He states that he began having pain in his left leg and he went to the [facility] where he was told he had sciatica.  He was treated with prednisone and muscle relaxers.  He states this did not help his pain.  Due to persistent pain, he presented back to the ED yesterday where he underwent a CT scan that revealed an enlargement of the left iliopsoas complex, but no gross abscess or hematoma.  An MRI was then obtained which revealed prominence of the intra and intermuscular fluid in the left iliopsoas muscles concerning for myofasciitis.  Within the deep iliopsoas muscles there is a 1.1 x 1.6 x 6.8cm fluid collection possibly representing a hematoma, but superimposed infection could not be ruled out.  IR has been asked to see the patient for possible aspiration vs drain placement.  Past Medical History:  Past Medical History:  Diagnosis Date  . Anxiety   . Asthma   . Barrett's esophagus   . Depression   . Diverticulitis   . Gastritis   . GERD (gastroesophageal reflux disease)   . Hiatal hernia   . HTN (hypertension)     Past Surgical History:  Past Surgical History:  Procedure Laterality Date  . CHOLECYSTECTOMY      Family History:  Family History  Problem Relation Age of Onset  . Breast cancer Mother   . Asthma Mother   . Gallstones Mother   . Heart attack Father   . Diabetes Father   . Prostate cancer Father   . Gallbladder disease Maternal Grandmother   . Stroke Maternal Grandmother   . Colon cancer Neg Hx   . Esophageal cancer Neg Hx   . Pancreatic cancer Neg Hx   . Rectal cancer Neg Hx   . Stomach cancer Neg Hx     Social History:  reports that he has been smoking Cigarettes.  He started smoking about 32 years ago. He has  a 30.00 pack-year smoking history. He has never used smokeless tobacco. He reports that he drinks alcohol. He reports that he uses drugs.  Allergies:  Allergies  Allergen Reactions  . Penicillins Hives    Pt's identical twin had reaction    Medications: Medications reviewed in epic  Please HPI for pertinent positives, otherwise complete 10 system ROS negative.  Mallampati Score: MD Evaluation Airway: WNL Heart: WNL Abdomen: WNL Chest/ Lungs: WNL ASA  Classification: 2 Mallampati/Airway Score: One  Physical Exam: BP (!) 153/94 (BP Location: Right Arm)   Pulse 91   Temp 98.1 F (36.7 C) (Oral)   Resp 19   Ht 5' 10"  (1.778 m)   Wt 261 lb 11.2 oz (118.7 kg)   SpO2 97%   BMI 37.55 kg/m  Body mass index is 37.55 kg/m. General: pleasant, WD, WN white male who is laying in bed in NAD HEENT: head is normocephalic, atraumatic.  Sclera are noninjected.  PERRL.  Ears and nose without any masses or lesions.  Mouth is pink and moist Heart: regular, rate, and rhythm.  Normal s1,s2. No obvious murmurs, gallops, or rubs noted.  Palpable radial pulses bilaterally Lungs: CTAB, no wheezes, rhonchi, or rales noted.  Respiratory effort nonlabored Abd: soft, NT, ND, +BS, no masses, hernias,  or organomegaly Psych: A&Ox3 with an appropriate affect.   Labs: Results for orders placed or performed during the hospital encounter of 04/27/17 (from the past 48 hour(s))  CBC with Differential     Status: Abnormal   Collection Time: 04/27/17 10:43 AM  Result Value Ref Range   WBC 16.6 (H) 4.0 - 10.5 K/uL   RBC 6.37 (H) 4.22 - 5.81 MIL/uL   Hemoglobin 17.9 (H) 13.0 - 17.0 g/dL   HCT 53.6 (H) 39.0 - 52.0 %   MCV 84.1 78.0 - 100.0 fL   MCH 28.1 26.0 - 34.0 pg   MCHC 33.4 30.0 - 36.0 g/dL   RDW 13.5 11.5 - 15.5 %   Platelets 194 150 - 400 K/uL   Neutrophils Relative % 74 %   Neutro Abs 13.4 (H) 1.7 - 7.7 K/uL   Lymphocytes Relative 19 %   Lymphs Abs 3.4 0.7 - 4.0 K/uL   Monocytes Relative 6 %     Monocytes Absolute 1.0 0.1 - 1.0 K/uL   Eosinophils Relative 1 %   Eosinophils Absolute 0.2 0.0 - 0.7 K/uL   Basophils Relative 0 %   Basophils Absolute 0.0 0.0 - 0.1 K/uL  Comprehensive metabolic panel     Status: Abnormal   Collection Time: 04/27/17 10:43 AM  Result Value Ref Range   Sodium 139 135 - 145 mmol/L   Potassium 3.2 (L) 3.5 - 5.1 mmol/L   Chloride 102 101 - 111 mmol/L   CO2 27 22 - 32 mmol/L   Glucose, Bld 136 (H) 65 - 99 mg/dL   BUN 14 6 - 20 mg/dL   Creatinine, Ser 1.25 (H) 0.61 - 1.24 mg/dL   Calcium 9.2 8.9 - 10.3 mg/dL   Total Protein 7.2 6.5 - 8.1 g/dL   Albumin 3.9 3.5 - 5.0 g/dL   AST 25 15 - 41 U/L   ALT 50 17 - 63 U/L   Alkaline Phosphatase 91 38 - 126 U/L   Total Bilirubin 0.8 0.3 - 1.2 mg/dL   GFR calc non Af Amer >60 >60 mL/min   GFR calc Af Amer >60 >60 mL/min    Comment: (NOTE) The eGFR has been calculated using the CKD EPI equation. This calculation has not been validated in all clinical situations. eGFR's persistently <60 mL/min signify possible Chronic Kidney Disease.    Anion gap 10 5 - 15  Sedimentation rate     Status: None   Collection Time: 04/27/17 10:43 AM  Result Value Ref Range   Sed Rate 3 0 - 16 mm/hr  C-reactive protein     Status: Abnormal   Collection Time: 04/27/17 10:43 AM  Result Value Ref Range   CRP 1.4 (H) <1.0 mg/dL    Comment: Performed at [facility] [address], [place], [place] [address]  Blood culture (routine x 2)     Status: None (Preliminary result)   Collection Time: 04/27/17 10:43 AM  Result Value Ref Range   Specimen Description BLOOD RIGHT ANTECUBITAL    Special Requests      BOTTLES DRAWN AEROBIC AND ANAEROBIC Blood Culture adequate volume   Culture      NO GROWTH < 24 HOURS Performed at [facility], [place] [address] [place], [place] [address]    Report Status PENDING   I-Stat CG4 Lactic Acid, ED     Status: Abnormal   Collection Time: 04/27/17 10:48 AM  Result Value Ref Range    Lactic Acid, Venous 2.13 (  HH) 0.5 - 1.9 mmol/L   Comment NOTIFIED PHYSICIAN   Blood culture (routine x 2)     Status: None (Preliminary result)   Collection Time: 04/27/17 10:57 AM  Result Value Ref Range   Specimen Description BLOOD LEFT ANTECUBITAL    Special Requests      BOTTLES DRAWN AEROBIC AND ANAEROBIC Blood Culture adequate volume   Culture      NO GROWTH < 24 HOURS Performed at [facility] [address], [place], [place] [address]    Report Status PENDING   I-Stat CG4 Lactic Acid, ED     Status: None   Collection Time: 04/27/17  3:09 PM  Result Value Ref Range   Lactic Acid, Venous 0.91 0.5 - 1.9 mmol/L    Imaging: Mr Pelvis W Wo Contrast  Result Date: 04/27/2017 CLINICAL DATA:  Left psoas muscle infection. EXAM: MRI PELVIS WITHOUT AND WITH CONTRAST TECHNIQUE: Multiplanar multisequence MR imaging of the pelvis was performed both before and after administration of intravenous contrast. CONTRAST:  15m MULTIHANCE GADOBENATE DIMEGLUMINE 529 MG/ML IV SOLN COMPARISON:  CT abdomen pelvis from same date. FINDINGS: Bones: No suspicious marrow signal abnormality. No fracture or dislocation. Joint/cartilage: No hip joint effusions. Probable tearing of the left anterior superior labrum Ligaments: Not applicable for exam/indication. Tendons/muscles: There is prominent edema involving the left iliopsoas muscles extending inferiorly from L4 along the iliopsoas tendon into the proximal thigh. Edema is also seen in the pectineus and adductor longus and brevis muscles. There is prominent fascial fluid anterior to the left iliopsoas muscles, extending inferiorly along the iliopsoas tendon. A small amount of fluid is seen surrounding the femoral nerve and vessels. Within the distal left iliopsoas muscle, there is a 1.1 x 1.6 x 6.8 cm (AP by transverse by CC) T2 hypointense rim enhancing fluid collection with heterogeneous T1 signal, including a few areas of T1 hyperintensity. The bilateral  iliopsoas, gluteal, and hamstring tendons are intact. Soft tissues: The visualized intrapelvic soft tissues demonstrate sigmoid diverticulosis. IMPRESSION: 1. Prominent intra- and intermuscular fluid involving the left iliopsoas muscles extending into the proximal thigh, concerning for infectious myofasciitis given the elevated white blood cell count and history of fever. 2. Within the distal left iliopsoas muscle, there is a 1.1 x 1.6 x 6.8 cm fluid collection that is relatively hypointense on T2 weighted imaging and has areas of increased T1 signal, possibly representing a hematoma. Superimposed infection is likely given the prominent surrounding inflammatory changes in the left iliopsoas muscle. 3. No evidence of osteomyelitis or left hip joint effusion. Electronically Signed   By: WTitus DubinM.D.   On: 04/27/2017 15:34   Ct Abdomen Pelvis W Contrast  Result Date: 04/27/2017 CLINICAL DATA:  Left lower back pain radiating down left leg. Generalized abdominal discomfort. EXAM: CT ABDOMEN AND PELVIS WITH CONTRAST TECHNIQUE: Multidetector CT imaging of the abdomen and pelvis was performed using the standard protocol following bolus administration of intravenous contrast. CONTRAST:  1023mISOVUE-300 IOPAMIDOL (ISOVUE-300) INJECTION 61% COMPARISON:  11/13/2014 FINDINGS: Lower chest:  Unremarkable Hepatobiliary: The liver shows diffusely decreased attenuation suggesting steatosis. Gallbladder surgically absent. No intrahepatic or extrahepatic biliary dilation. Pancreas: No focal mass lesion. No dilatation of the main duct. No intraparenchymal cyst. No peripancreatic edema. Spleen: No splenomegaly. No focal mass lesion. Adrenals/Urinary Tract: No adrenal nodule or mass. Kidneys unremarkable. No evidence for hydroureter. The urinary bladder appears normal for the degree of distention. Stomach/Bowel: Stomach is nondistended. No gastric wall thickening. No evidence of outlet obstruction.  Duodenum is normally  positioned as is the ligament of Treitz. No small bowel wall thickening. No small bowel dilatation. The terminal ileum is normal. The appendix is normal. Diverticular changes are noted in the left colon without evidence of diverticulitis. Vascular/Lymphatic: No abdominal aortic aneurysm. No abdominal aortic atherosclerotic calcification. There is no gastrohepatic or hepatoduodenal ligament lymphadenopathy. No intraperitoneal or retroperitoneal lymphadenopathy. No pelvic sidewall lymphadenopathy. Reproductive: The prostate gland and seminal vesicles have normal imaging features. Other: No intraperitoneal free fluid. Musculoskeletal: There is enlargement of the left ileo psoas complex with associated extraperitoneal edema/ inflammation in the left pelvic sidewall. These changes track from about the level of L4-5 down to the left groin region. There is some hyperenhancement in the ileo site was complex anterior to the left hip joint (see image 40 series 5). No definite rim enhancing intramuscular fluid collection by CT. Bone windows reveal no worrisome lytic or sclerotic osseous lesions. IMPRESSION: Enlargement of the left ileo psoas complex along the left pelvic sidewall with hyperenhancement in the muscle anterior to the left hip. No gross intermuscular abscess or hematoma can be identified by CT. Given the patient's history of elevated white count and fever, infectious etiology favored over muscle injury or intermuscular hematoma. No obvious bony destruction identified in the lower lumbar spine, sacrum, or left bony pelvis. MRI of the pelvis without and with contrast may prove helpful to further evaluate. I discussed these findings by telephone with Dr. I 6 at the time of study interpretation. Electronically Signed   By: Misty Stanley M.D.   On: 04/27/2017 12:31    Assessment/Plan 1. Left iliopsoas fluid collection  We will plan to proceed with an aspiration vs drain placement into this fluid collection today.   He is NPO and labs and vitals have been reviewed.  His PT/INR is pending currently.   Risks and benefits discussed with the patient including bleeding, infection, damage to adjacent structures, and sepsis. All of the patient's questions were answered, patient is agreeable to proceed. Consent signed and in chart.  Thank you for this interesting consult.  I greatly enjoyed meeting AKBAR SACRA and look forward to participating in their care.  A copy of this report was sent to the requesting provider on this date.  Electronically Signed: Henreitta Cea 04/28/2017, 10:10 AM   I spent a total of 40 Minutes    in face to face in clinical consultation, greater than 50% of which was counseling/coordinating care for left iliopsoas fluid collection

## 2017-04-28 NOTE — Consult Note (Signed)
[facility] (OTS) Consult   Patient ID: Bryan Collins MRN: [id number] DOB/AGE: Feb 19, 1966 51 y.o.   Reason for Consult:Left Iliopsoas abscess Referring Physician: Edmonia Lynch, MD [facility]  HPI: Bryan Collins is an 51 y.o. male who is being seen in consultation at the request of Dr. Percell Miller for evaluation of left iliopsoas abscess. About 2.5 weeks ago the patient developed atrumatic low back and hip pain. He was seen by a physician and diagnosed with sciatica but it continued to worsen. He eventually developed night sweats and returned to the ED. An MRI was performed of his pelvis which showed an abscess and orthopaedics was consulted. The imaging was reviewed with Dr. Percell Miller and I was consulted for possible I&D of the abscess.  Pain has not improved since he has been on antibiotics. Denies fevers but notes chills. No SOB/Chest pain. No DVT. He is a smoker and is on disability for depression and anxiety.  Past Medical History:  Diagnosis Date  . Anxiety   . Asthma   . Barrett's esophagus   . Depression   . Diverticulitis   . Gastritis   . GERD (gastroesophageal reflux disease)   . Hiatal hernia   . HTN (hypertension)     Past Surgical History:  Procedure Laterality Date  . CHOLECYSTECTOMY      Family History  Problem Relation Age of Onset  . Breast cancer Mother   . Asthma Mother   . Gallstones Mother   . Heart attack Father   . Diabetes Father   . Prostate cancer Father   . Gallbladder disease Maternal Grandmother   . Stroke Maternal Grandmother   . Colon cancer Neg Hx   . Esophageal cancer Neg Hx   . Pancreatic cancer Neg Hx   . Rectal cancer Neg Hx   . Stomach cancer Neg Hx     Social History:  reports that he has been smoking Cigarettes.  He started smoking about 32 years ago. He has a 30.00 pack-year smoking history. He has never used smokeless tobacco. He reports that he drinks alcohol. He reports that he uses  drugs.  Allergies:  Allergies  Allergen Reactions  . Penicillins Hives    Pt's identical twin had reaction    Medications: I have reviewed the patient's current medications.  ROS: As above in HPI  Blood pressure (!) 153/94, pulse 91, temperature 98.1 F (36.7 C), temperature source Oral, resp. rate 19, height 5' 10"  (1.778 m), weight 118.7 kg (261 lb 11.2 oz), SpO2 97 %. Exam: BUE/RLE: Skin without lesions, no tenderness to palpation, full painless ROM with full strength in each muscle groups. LLE: No skin lesions. Unable to perform active straight leg raise. Gentle hip ROM with pain. Dorsiflexion strength diminished compared to contralateral side about 4/5. 5/5 plantarflexion. Sensation grossly intact with warm and well perfused foot. +TTP over anterior hip in location of abscess, no fluctuance appreciated.  Imaging: MRI of pelvis was reviewed which shows significant edema around the iliopsoas in the false pelvis. No fluid collections seen in the false pelvis. There is a collection in the iliopsoas muscle near the tendon and insertion to the lesser trochanter.  Labs: WBC 16.6, CRP 1.4, ESR 3  Assessment/Plan: 51 yo male with left iliopsoas abscess  I have reviewed the imaging and feel that there is only one collection that I feel requires drainage. It is more distal then I can likely access with a lateral window to the pelvis. I feel that with  is location close to the vessels a CT guided drain placement by IR would be most appropriate. This combined with antibiotics, I feel, will alleviate his symptoms. His infection markers other than his WBC are only mildly elevated. I will continue to follow up after the drain placement and if no improvement with inflammatory markers or clinical symptoms we can revisit formal I&D. Discussed this with the patient and he agrees with plan.  -No surgical I&D needed at this point, recommend IR drain placement -Serial inflammatory markers, -WBAT  LLE.   Shona Needles, MD [facility] [phone] (phone)

## 2017-04-28 NOTE — Sedation Documentation (Signed)
Patient is resting comfortably. 

## 2017-04-28 NOTE — Consult Note (Signed)
ORTHOPAEDIC CONSULTATION  REQUESTING PHYSICIAN: Patrecia Pour, Christean Grief, MD  Chief Complaint: left leg pain  Assessment / Plan: Principal Problem:   Psoas abscess ([place]) Active Problems:   GAD (generalized anxiety disorder)   Essential hypertension  Left psoas abscess Agree with plan for IV antibiotics and IR drainage. Dr. Edmonia Lynch has discussed with Dr. Doreatha Martin (orthopedic trauma) who will also evaluate patient and will be available should surgical intervention be needed. Nothing by mouth  HPI: Bryan Collins is a 51 y.o. male who complains of worsening left-sided lower back/hip pain worsening over several weeks.  He presented to the ED where MRI showed an intramuscular fluid collection within the distal left iliopsoas muscle. No evidence of osteomyelitis or left hip joint effusion.  Orthopedics was consulted for evaluation.  Today he is in significant pain. He denies fever or chills. He has been nothing by mouth after midnight. He denies history of MI, CVA, or DVT. He smokes daily. He drinks alcohol rarely.  Past Medical History:  Diagnosis Date  . Anxiety   . Asthma   . Barrett's esophagus   . Depression   . Diverticulitis   . Gastritis   . GERD (gastroesophageal reflux disease)   . Hiatal hernia   . HTN (hypertension)    Past Surgical History:  Procedure Laterality Date  . CHOLECYSTECTOMY     Social History   Social History  . Marital status: Single    Spouse name: N/A  . Number of children: 0  . Years of education: N/A   Occupational History  . disabled    Social History Main Topics  . Smoking status: Current Every Day Smoker    Packs/day: 1.00    Years: 30.00    Types: Cigarettes    Start date: 01/22/1985  . Smokeless tobacco: Never Used  . Alcohol use 0.0 oz/week     Comment: occasional  . Drug use: Yes     Comment: occasionally  . Sexual activity: Not Currently   Other Topics Concern  . None   Social History Narrative  . None   Family  History  Problem Relation Age of Onset  . Breast cancer Mother   . Asthma Mother   . Gallstones Mother   . Heart attack Father   . Diabetes Father   . Prostate cancer Father   . Gallbladder disease Maternal Grandmother   . Stroke Maternal Grandmother   . Colon cancer Neg Hx   . Esophageal cancer Neg Hx   . Pancreatic cancer Neg Hx   . Rectal cancer Neg Hx   . Stomach cancer Neg Hx    Allergies  Allergen Reactions  . 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 Authorizing Provider  ADVAIR DISKUS 250-50 MCG/DOSE AEPB INHALE 1 PUFF EVERY DAY 01/13/17  Yes Gottschalk, Ashly M, DO  albuterol (PROVENTIL HFA;VENTOLIN HFA) 108 (90 BASE) MCG/ACT inhaler Inhale 2 puffs into the lungs every 6 (six) hours as needed for wheezing or shortness of breath. 01/25/15  Yes Vivi Barrack, MD  amLODipine (NORVASC) 5 MG tablet Take 1 tablet (5 mg total) by mouth daily. 06/25/16  Yes Vivi Barrack, MD  bismuth subsalicylate (PEPTO BISMOL) 262 MG/15ML suspension Take 30 mLs by mouth every 6 (six) hours as needed for indigestion.   Yes [provider]  clonazePAM (KLONOPIN) 0.5 MG tablet Take 1 tablet (0.5 mg  total) by mouth every 6 (six) hours as needed for anxiety. Patient taking differently: Take 1 mg by mouth daily.  03/16/17  Yes Smiley Houseman, MD  cyclobenzaprine (FLEXERIL) 10 MG tablet Take 1 tablet (10 mg total) by mouth 2 (two) times daily as needed for muscle spasms. 04/19/17  Yes Neese, Hope M, NP  ibuprofen (ADVIL,MOTRIN) 200 MG tablet Take 400-600 mg by mouth every 6 (six) hours as needed for moderate pain.   Yes [provider]  naproxen sodium (ANAPROX) 220 MG tablet Take 440 mg by mouth 2 (two) times daily with a meal.   Yes [provider]  omeprazole (PRILOSEC) 40 MG capsule Take 1 capsule (40 mg total) by mouth 2 (two) times daily before lunch and supper. 02/10/17  Yes  Nandigam, Venia Minks, MD  sertraline (ZOLOFT) 100 MG tablet Take 2 tablets (200 mg total) by mouth daily. 12/22/16  Yes Vivi Barrack, MD  predniSONE (DELTASONE) 10 MG tablet Take 2 tablets (20 mg total) by mouth 2 (two) times daily with a meal. Patient not taking: Reported on 04/27/2017 04/19/17   Ashley Murrain, NP   Mr Pelvis W Wo Contrast  Result Date: 04/27/2017 CLINICAL DATA:  Left psoas muscle infection. EXAM: MRI PELVIS WITHOUT AND WITH CONTRAST TECHNIQUE: Multiplanar multisequence MR imaging of the pelvis was performed both before and after administration of intravenous contrast. CONTRAST:  32mL MULTIHANCE GADOBENATE DIMEGLUMINE 529 MG/ML IV SOLN COMPARISON:  CT abdomen pelvis from same date. FINDINGS: Bones: No suspicious marrow signal abnormality. No fracture or dislocation. Joint/cartilage: No hip joint effusions. Probable tearing of the left anterior superior labrum Ligaments: Not applicable for exam/indication. Tendons/muscles: There is prominent edema involving the left iliopsoas muscles extending inferiorly from L4 along the iliopsoas tendon into the proximal thigh. Edema is also seen in the pectineus and adductor longus and brevis muscles. There is prominent fascial fluid anterior to the left iliopsoas muscles, extending inferiorly along the iliopsoas tendon. A small amount of fluid is seen surrounding the femoral nerve and vessels. Within the distal left iliopsoas muscle, there is a 1.1 x 1.6 x 6.8 cm (AP by transverse by CC) T2 hypointense rim enhancing fluid collection with heterogeneous T1 signal, including a few areas of T1 hyperintensity. The bilateral iliopsoas, gluteal, and hamstring tendons are intact. Soft tissues: The visualized intrapelvic soft tissues demonstrate sigmoid diverticulosis. IMPRESSION: 1. Prominent intra- and intermuscular fluid involving the left iliopsoas muscles extending into the proximal thigh, concerning for infectious myofasciitis given the elevated white blood cell  count and history of fever. 2. Within the distal left iliopsoas muscle, there is a 1.1 x 1.6 x 6.8 cm fluid collection that is relatively hypointense on T2 weighted imaging and has areas of increased T1 signal, possibly representing a hematoma. Superimposed infection is likely given the prominent surrounding inflammatory changes in the left iliopsoas muscle. 3. No evidence of osteomyelitis or left hip joint effusion. Electronically Signed   By: Titus Dubin M.D.   On: 04/27/2017 15:34   Ct Abdomen Pelvis W Contrast  Result Date: 04/27/2017 CLINICAL DATA:  Left lower back pain radiating down left leg. Generalized abdominal discomfort. EXAM: CT ABDOMEN AND PELVIS WITH CONTRAST TECHNIQUE: Multidetector CT imaging of the abdomen and pelvis was performed using the standard protocol following bolus administration of intravenous contrast. CONTRAST:  178mL ISOVUE-300 IOPAMIDOL (ISOVUE-300) INJECTION 61% COMPARISON:  11/13/2014 FINDINGS: Lower chest:  Unremarkable Hepatobiliary: The liver shows diffusely decreased attenuation suggesting steatosis. Gallbladder surgically absent. No intrahepatic or extrahepatic  biliary dilation. Pancreas: No focal mass lesion. No dilatation of the main duct. No intraparenchymal cyst. No peripancreatic edema. Spleen: No splenomegaly. No focal mass lesion. Adrenals/Urinary Tract: No adrenal nodule or mass. Kidneys unremarkable. No evidence for hydroureter. The urinary bladder appears normal for the degree of distention. Stomach/Bowel: Stomach is nondistended. No gastric wall thickening. No evidence of outlet obstruction. Duodenum is normally positioned as is the ligament of Treitz. No small bowel wall thickening. No small bowel dilatation. The terminal ileum is normal. The appendix is normal. Diverticular changes are noted in the left colon without evidence of diverticulitis. Vascular/Lymphatic: No abdominal aortic aneurysm. No abdominal aortic atherosclerotic calcification. There is no  gastrohepatic or hepatoduodenal ligament lymphadenopathy. No intraperitoneal or retroperitoneal lymphadenopathy. No pelvic sidewall lymphadenopathy. Reproductive: The prostate gland and seminal vesicles have normal imaging features. Other: No intraperitoneal free fluid. Musculoskeletal: There is enlargement of the left ileo psoas complex with associated extraperitoneal edema/ inflammation in the left pelvic sidewall. These changes track from about the level of L4-5 down to the left groin region. There is some hyperenhancement in the ileo site was complex anterior to the left hip joint (see image 40 series 5). No definite rim enhancing intramuscular fluid collection by CT. Bone windows reveal no worrisome lytic or sclerotic osseous lesions. IMPRESSION: Enlargement of the left ileo psoas complex along the left pelvic sidewall with hyperenhancement in the muscle anterior to the left hip. No gross intermuscular abscess or hematoma can be identified by CT. Given the patient's history of elevated white count and fever, infectious etiology favored over muscle injury or intermuscular hematoma. No obvious bony destruction identified in the lower lumbar spine, sacrum, or left bony pelvis. MRI of the pelvis without and with contrast may prove helpful to further evaluate. I discussed these findings by telephone with Dr. I 6 at the time of study interpretation. Electronically Signed   By: Misty Stanley M.D.   On: 04/27/2017 12:31    Positive ROS: All other systems have been reviewed and were otherwise negative with the exception of those mentioned in the HPI and as above.  Objective: Labs cbc  Recent Labs  04/27/17 1043  WBC 16.6*  HGB 17.9*  HCT 53.6*  PLT 194    Labs inflam  Recent Labs  04/27/17 1043  CRP 1.4*    Labs coag No results for input(s): INR, PTT in the last 72 hours.  Invalid input(s): PT   Recent Labs  04/27/17 1043  NA 139  K 3.2*  CL 102  CO2 27  GLUCOSE 136*  BUN 14    CREATININE 1.25*  CALCIUM 9.2    Physical Exam: Vitals:   04/27/17 2259 04/28/17 0607  BP: 121/84 (!) 153/94  Pulse: 95 91  Resp: 19   Temp: 98.6 F (37 C) 98.1 F (36.7 C)  SpO2: 95% 97%   General: Alert, no acute distress.  Calm, conversant.  He does not look toxic. Mental status: Alert and Oriented x3 Neurologic: Speech Clear and organized, no gross focal findings or movement disorder appreciated. Respiratory: No cyanosis, no use of accessory musculature Cardiovascular: No pedal edema GI: Abdomen is soft and non-tender, non-distended. Skin: Warm and dry.  No lesions in the area of chief complaint  Extremities: Warm and well perfused w/o edema Psychiatric: Patient is competent for consent with normal mood and affect  MUSCULOSKELETAL:  Left lower extremity pain with resisted hip flexion. He denies pain with passive internal and external rotation of the hip.  Moderately tender  to palpation superior aspect of his anterior lateral Left thigh - mild area of swelling here but without palpable fluctuance. No erythema, lesion. Other extremities are atraumatic with painless ROM and NVI.   Prudencio Burly III PA-C 04/28/2017 8:26 AM

## 2017-04-28 NOTE — Procedures (Signed)
Pre procedural Dx: Indeterminate fluid collection within the left iliopsoas muscle  Post procedural Dx: Same  Technically successful CT and US guided aspiration of 1 cc of bloody fluid  All aspirated fluid capped and sent to lab for analysis.   EBL: None.   Complications: None immediate.   Ronny Bacon, MD Pager #: [phone]

## 2017-04-29 DIAGNOSIS — I10 Essential (primary) hypertension: Secondary | ICD-10-CM

## 2017-04-29 LAB — BASIC METABOLIC PANEL
ANION GAP: 5 (ref 5–15)
BUN: 8 mg/dL (ref 6–20)
CALCIUM: 8.6 mg/dL — AB (ref 8.9–10.3)
CO2: 29 mmol/L (ref 22–32)
CREATININE: 0.99 mg/dL (ref 0.61–1.24)
Chloride: 105 mmol/L (ref 101–111)
Glucose, Bld: 89 mg/dL (ref 65–99)
Potassium: 3.5 mmol/L (ref 3.5–5.1)
Sodium: 139 mmol/L (ref 135–145)

## 2017-04-29 LAB — CBC WITH DIFFERENTIAL/PLATELET
BASOS ABS: 0 10*3/uL (ref 0.0–0.1)
BASOS PCT: 0 %
EOS ABS: 0.2 10*3/uL (ref 0.0–0.7)
EOS PCT: 3 %
HCT: 46.1 % (ref 39.0–52.0)
Hemoglobin: 16.1 g/dL (ref 13.0–17.0)
LYMPHS ABS: 2 10*3/uL (ref 0.7–4.0)
Lymphocytes Relative: 23 %
MCH: 30.7 pg (ref 26.0–34.0)
MCHC: 34.9 g/dL (ref 30.0–36.0)
MCV: 87.8 fL (ref 78.0–100.0)
Monocytes Absolute: 0.7 10*3/uL (ref 0.1–1.0)
Monocytes Relative: 8 %
Neutro Abs: 5.8 10*3/uL (ref 1.7–7.7)
Neutrophils Relative %: 66 %
PLATELETS: 123 10*3/uL — AB (ref 150–400)
RBC: 5.25 MIL/uL (ref 4.22–5.81)
RDW: 13.5 % (ref 11.5–15.5)
WBC: 8.8 10*3/uL (ref 4.0–10.5)

## 2017-04-29 LAB — MAGNESIUM: MAGNESIUM: 2 mg/dL (ref 1.7–2.4)

## 2017-04-29 LAB — HIV ANTIBODY (ROUTINE TESTING W REFLEX): HIV SCREEN 4TH GENERATION: NONREACTIVE

## 2017-04-29 MED ORDER — LEVOFLOXACIN 750 MG PO TABS: 750.0000 mg | ORAL_TABLET | Freq: Every day | ORAL | 0 refills | Status: DC

## 2017-04-29 MED ORDER — DOXYCYCLINE HYCLATE 50 MG PO CAPS: 50.0000 mg | ORAL_CAPSULE | Freq: Two times a day (BID) | ORAL | 0 refills | Status: AC

## 2017-04-29 MED ORDER — OXYCODONE-ACETAMINOPHEN 5-325 MG PO TABS: 1.0000 | ORAL_TABLET | Freq: Four times a day (QID) | ORAL | 0 refills | Status: AC | PRN

## 2017-04-29 NOTE — Progress Notes (Signed)
Discharge instructions (including medications) discussed with and copy provided to patient/caregiver 

## 2017-04-29 NOTE — Discharge Summary (Signed)
Physician Discharge Summary  Bryan Collins  UVO:[id number]  DOB: [birth year]  DOA: 04/27/2017 PCP: Smiley Houseman, MD  Admit date: 04/27/2017 Discharge date: 04/29/2017  Admitted From: Home  Disposition:  Home   Recommendations for Outpatient Follow-up:  1. Follow up with PCP in 1 weeks 2. Please obtain BMP/CBC in one week 3. Please repeat MRI in 3-4 weeks to monitor for discitis  4. HIV and Quantiferon pending   Discharge Condition: Stable  CODE STATUS: FULL  Diet recommendation: Heart Healthy  Brief/Interim Summary: Bryan Collins is a 51 year old male with history of diverticular disease and hypertension presented to the emergency department with sudden onset of left back pain with radiation to his left leg.upon ED evaluation CT scan was notable for infection along the left psoas muscle consistent with an abscess. Orthopedic surgery was consulted for possible I&D were recommended intervention by IR. Patient was admitted for IV antibiotics and further workup.   Subjective: Patient seen and examined, only c/o mild back pain, much improved from yesterday. No acute events overnight. Patient remains afebrile, he is status post CT guided aspiration. Tolerating diet and ambulating with no issues.   Discharge Diagnoses/Hospital Course:  Iliopsoas abscess status post CT guided aspiration MRI with evidence of abscess,no osteomyelitis noted. Inflammatory markers mildly elevated - CRP 1.4 WBC 16.6 - WBC normalized  Unclear etiology at this time patient denies IV drug use, no immunocompromise, no trauma or recent abx tx.  Case was discussed with ID - recommended to treat with Doxy BID and Levaquin 750 mg daily for total of 4 weeks. Repeat MRI in 3-4 weeks. Medications side effects where discussed and patient understands.  Blood cultures thus far negative  HIV and QuantiFERON pending  Patient was evaluated by orthopedic surgery whom recommended no surgical intervention at this time  Pain  medication prescribed   Hypertension Blood pressure stable Continue amlodipine  GERD Continue omeprazole  Depression No SI/HI Continue home medications sertraline and Klonopin  Hypokalemia Resolved   All other chronic medical condition were stable during the hospitalization.  On the day of the discharge the patient's vitals were stable, and no other acute medical condition were reported by patient. Patient was felt safe to be discharge to home  Discharge Instructions  You were cared for by a hospitalist during your hospital stay. If you have any questions about your discharge medications or the care you received while you were in the hospital after you are discharged, you can call the unit and asked to speak with the hospitalist on call if the hospitalist that took care of you is not available. Once you are discharged, your primary care physician will handle any further medical issues. Please note that NO REFILLS for any discharge medications will be authorized once you are discharged, as it is imperative that you return to your primary care physician (or establish a relationship with a primary care physician if you do not have one) for your aftercare needs so that they can reassess your need for medications and monitor your lab values.  Discharge Instructions    Call MD for:  difficulty breathing, headache or visual disturbances    Complete by:  As directed    Call MD for:  extreme fatigue    Complete by:  As directed    Call MD for:  hives    Complete by:  As directed    Call MD for:  persistant dizziness or light-headedness    Complete by:  As directed  Call MD for:  persistant nausea and vomiting    Complete by:  As directed    Call MD for:  redness, tenderness, or signs of infection (pain, swelling, redness, odor or green/yellow discharge around incision site)    Complete by:  As directed    Call MD for:  severe uncontrolled pain    Complete by:  As directed    Call  MD for:  temperature >100.4    Complete by:  As directed    Diet - low sodium heart healthy    Complete by:  As directed    Increase activity slowly    Complete by:  As directed      Allergies as of 04/29/2017      Reactions   Penicillins Hives   Pt's identical twin had reaction      Medication List    STOP taking these medications   cyclobenzaprine 10 MG tablet Commonly known as:  FLEXERIL   ibuprofen 200 MG tablet Commonly known as:  ADVIL,MOTRIN   naproxen sodium 220 MG tablet Commonly known as:  ANAPROX   predniSONE 10 MG tablet Commonly known as:  DELTASONE     TAKE these medications   ADVAIR DISKUS 250-50 MCG/DOSE Aepb Generic drug:  Fluticasone-Salmeterol INHALE 1 PUFF EVERY DAY   albuterol 108 (90 Base) MCG/ACT inhaler Commonly known as:  PROVENTIL HFA;VENTOLIN HFA Inhale 2 puffs into the lungs every 6 (six) hours as needed for wheezing or shortness of breath.   amLODipine 5 MG tablet Commonly known as:  NORVASC Take 1 tablet (5 mg total) by mouth daily.   bismuth subsalicylate 833 AS/50NL suspension Commonly known as:  PEPTO BISMOL Take 30 mLs by mouth every 6 (six) hours as needed for indigestion.   clonazePAM 0.5 MG tablet Commonly known as:  KLONOPIN Take 1 tablet (0.5 mg total) by mouth every 6 (six) hours as needed for anxiety. What changed:  how much to take  when to take this   doxycycline 50 MG capsule Commonly known as:  VIBRAMYCIN Take 1 capsule (50 mg total) by mouth 2 (two) times daily.   levofloxacin 750 MG tablet Commonly known as:  LEVAQUIN Take 1 tablet (750 mg total) by mouth daily.   omeprazole 40 MG capsule Commonly known as:  PRILOSEC Take 1 capsule (40 mg total) by mouth 2 (two) times daily before lunch and supper.   oxyCODONE-acetaminophen 5-325 MG tablet Commonly known as:  PERCOCET/ROXICET Take 1-2 tablets by mouth every 6 (six) hours as needed for moderate pain.   sertraline 100 MG tablet Commonly known as:   ZOLOFT Take 2 tablets (200 mg total) by mouth daily.            Discharge Care Instructions        Start     Ordered   04/29/17 0000  oxyCODONE-acetaminophen (PERCOCET/ROXICET) 5-325 MG tablet  Every 6 hours PRN     04/29/17 1339   04/29/17 0000  doxycycline (VIBRAMYCIN) 50 MG capsule  2 times daily     04/29/17 1339   04/29/17 0000  levofloxacin (LEVAQUIN) 750 MG tablet  Daily     04/29/17 1339   04/29/17 0000  Increase activity slowly     04/29/17 1339   04/29/17 0000  Diet - low sodium heart healthy     04/29/17 1339   04/29/17 0000  Call MD for:  temperature >100.4     04/29/17 1339   04/29/17 0000  Call MD for:  persistant  nausea and vomiting     04/29/17 1339   04/29/17 0000  Call MD for:  severe uncontrolled pain     04/29/17 1339   04/29/17 0000  Call MD for:  redness, tenderness, or signs of infection (pain, swelling, redness, odor or green/yellow discharge around incision site)     04/29/17 1339   04/29/17 0000  Call MD for:  difficulty breathing, headache or visual disturbances     04/29/17 1339   04/29/17 0000  Call MD for:  hives     04/29/17 1339   04/29/17 0000  Call MD for:  persistant dizziness or light-headedness     04/29/17 1339   04/29/17 0000  Call MD for:  extreme fatigue     04/29/17 1339      Allergies  Allergen Reactions  . Penicillins Hives    Pt's identical twin had reaction    Consultations:  Case discussed with ID    Procedures/Studies: Mr Pelvis W Wo Contrast  Result Date: 04/27/2017 CLINICAL DATA:  Left psoas muscle infection. EXAM: MRI PELVIS WITHOUT AND WITH CONTRAST TECHNIQUE: Multiplanar multisequence MR imaging of the pelvis was performed both before and after administration of intravenous contrast. CONTRAST:  79mL MULTIHANCE GADOBENATE DIMEGLUMINE 529 MG/ML IV SOLN COMPARISON:  CT abdomen pelvis from same date. FINDINGS: Bones: No suspicious marrow signal abnormality. No fracture or dislocation. Joint/cartilage: No hip joint  effusions. Probable tearing of the left anterior superior labrum Ligaments: Not applicable for exam/indication. Tendons/muscles: There is prominent edema involving the left iliopsoas muscles extending inferiorly from L4 along the iliopsoas tendon into the proximal thigh. Edema is also seen in the pectineus and adductor longus and brevis muscles. There is prominent fascial fluid anterior to the left iliopsoas muscles, extending inferiorly along the iliopsoas tendon. A small amount of fluid is seen surrounding the femoral nerve and vessels. Within the distal left iliopsoas muscle, there is a 1.1 x 1.6 x 6.8 cm (AP by transverse by CC) T2 hypointense rim enhancing fluid collection with heterogeneous T1 signal, including a few areas of T1 hyperintensity. The bilateral iliopsoas, gluteal, and hamstring tendons are intact. Soft tissues: The visualized intrapelvic soft tissues demonstrate sigmoid diverticulosis. IMPRESSION: 1. Prominent intra- and intermuscular fluid involving the left iliopsoas muscles extending into the proximal thigh, concerning for infectious myofasciitis given the elevated white blood cell count and history of fever. 2. Within the distal left iliopsoas muscle, there is a 1.1 x 1.6 x 6.8 cm fluid collection that is relatively hypointense on T2 weighted imaging and has areas of increased T1 signal, possibly representing a hematoma. Superimposed infection is likely given the prominent surrounding inflammatory changes in the left iliopsoas muscle. 3. No evidence of osteomyelitis or left hip joint effusion. Electronically Signed   By: Titus Dubin M.D.   On: 04/27/2017 15:34   Ct Abdomen Pelvis W Contrast  Result Date: 04/27/2017 CLINICAL DATA:  Left lower back pain radiating down left leg. Generalized abdominal discomfort. EXAM: CT ABDOMEN AND PELVIS WITH CONTRAST TECHNIQUE: Multidetector CT imaging of the abdomen and pelvis was performed using the standard protocol following bolus administration of  intravenous contrast. CONTRAST:  116mL ISOVUE-300 IOPAMIDOL (ISOVUE-300) INJECTION 61% COMPARISON:  11/13/2014 FINDINGS: Lower chest:  Unremarkable Hepatobiliary: The liver shows diffusely decreased attenuation suggesting steatosis. Gallbladder surgically absent. No intrahepatic or extrahepatic biliary dilation. Pancreas: No focal mass lesion. No dilatation of the main duct. No intraparenchymal cyst. No peripancreatic edema. Spleen: No splenomegaly. No focal mass lesion. Adrenals/Urinary Tract: No adrenal nodule  or mass. Kidneys unremarkable. No evidence for hydroureter. The urinary bladder appears normal for the degree of distention. Stomach/Bowel: Stomach is nondistended. No gastric wall thickening. No evidence of outlet obstruction. Duodenum is normally positioned as is the ligament of Treitz. No small bowel wall thickening. No small bowel dilatation. The terminal ileum is normal. The appendix is normal. Diverticular changes are noted in the left colon without evidence of diverticulitis. Vascular/Lymphatic: No abdominal aortic aneurysm. No abdominal aortic atherosclerotic calcification. There is no gastrohepatic or hepatoduodenal ligament lymphadenopathy. No intraperitoneal or retroperitoneal lymphadenopathy. No pelvic sidewall lymphadenopathy. Reproductive: The prostate gland and seminal vesicles have normal imaging features. Other: No intraperitoneal free fluid. Musculoskeletal: There is enlargement of the left ileo psoas complex with associated extraperitoneal edema/ inflammation in the left pelvic sidewall. These changes track from about the level of L4-5 down to the left groin region. There is some hyperenhancement in the ileo site was complex anterior to the left hip joint (see image 40 series 5). No definite rim enhancing intramuscular fluid collection by CT. Bone windows reveal no worrisome lytic or sclerotic osseous lesions. IMPRESSION: Enlargement of the left ileo psoas complex along the left pelvic  sidewall with hyperenhancement in the muscle anterior to the left hip. No gross intermuscular abscess or hematoma can be identified by CT. Given the patient's history of elevated white count and fever, infectious etiology favored over muscle injury or intermuscular hematoma. No obvious bony destruction identified in the lower lumbar spine, sacrum, or left bony pelvis. MRI of the pelvis without and with contrast may prove helpful to further evaluate. I discussed these findings by telephone with Dr. I 6 at the time of study interpretation. Electronically Signed   By: Misty Stanley M.D.   On: 04/27/2017 12:31   Ct Aspiration  Result Date: 04/28/2017 INDICATION: Findings worrisome for infectious mouth fasciitis involving the left iliopsoas musculature, with indeterminate fluid collection within the left iliopsoas musculature. Please perform ultrasound / CT-guided aspiration for tissue diagnostic purposes. EXAM: CT GUIDANCE NEEDLE PLACEMENT COMPARISON:  Pelvic MRI - 04/27/2017; CT abdomen and pelvis - 04/28/2027 MEDICATIONS: The patient is currently admitted to the hospital and receiving intravenous antibiotics. The antibiotics were administered within an appropriate time frame prior to the initiation of the procedure. ANESTHESIA/SEDATION: Moderate (conscious) sedation was employed during this procedure. A total of Versed 2 mg and Fentanyl 100 mcg was administered intravenously. Moderate Sedation Time: 13 minutes. The patient's level of consciousness and vital signs were monitored continuously by radiology nursing throughout the procedure under my direct supervision. CONTRAST:  None COMPLICATIONS: None immediate. PROCEDURE: Informed written consent was obtained from the patient after a discussion of the risks, benefits and alternatives to treatment. The patient was placed supine on the CT gantry and a pre procedural CT was performed re-demonstrating the known abscess/fluid collection within the distal aspect of the  left iliopsoas musculature with ill-defined component measuring approximately 3.5 x 2.1 cm (image 29, series 2). The procedure was planned. A timeout was performed prior to the initiation of the procedure. The skin overlying the left groin was prepped and draped in usual sterile fashion. Under direct ultrasound guidance, ill-defined fluid collection within the distal aspect of the left iliopsoas musculature was accessed with a 22 gauge spinal needle after the overlying soft tissues were anesthetized with 1% lidocaine with epinephrine. Appropriate position was confirmed with CT imaging. Next, under direct ultrasound guidance, an 18 gauge trocar needle was advanced into the ill-defined fluid collection with the left iliopsoas musculature. Multiple  ultrasound images were saved for procedural documentation purposes. Appropriate position was confirmed with CT imaging. Despite appropriate needle positioning, only approximately 1 cc of bloody fluid was able to be aspirated from the indeterminate fluid collection. All aspirated fluid was capped and sent to the laboratory for analysis. A dressing was placed. The patient tolerated the procedure well without immediate post procedural complication. IMPRESSION: Technically successful ultrasound and CT guided aspiration of approximately 1 cc of bloody fluid from indeterminate fluid collection within the distal aspect of the left iliopsoas musculature, favored to represent a hematoma. The aspirated bloody fluid was capped and sent to the laboratory for analysis. Electronically Signed   By: Sandi Mariscal M.D.   On: 04/28/2017 15:40    Discharge Exam: Vitals:   04/29/17 0500 04/29/17 0933  BP: 134/74 (!) 155/82  Pulse: 78   Resp: 15   Temp: 98.4 F (36.9 C)   SpO2: 98%    Vitals:   04/28/17 1553 04/28/17 1941 04/29/17 0500 04/29/17 0933  BP: (!) 146/85 138/88 134/74 (!) 155/82  Pulse: 91 87 78   Resp: 18 16 15    Temp: 98 F (36.7 C) 98.2 F (36.8 C) 98.4 F (36.9  C)   TempSrc:  Oral Oral   SpO2: 95% 97% 98%   Weight:      Height:        General: Pt is alert, awake, not in acute distress Cardiovascular: RRR, S1/S2 +, no rubs, no gallops Respiratory: CTA bilaterally, no wheezing, no rhonchi Abdominal: Soft, NT, ND, bowel sounds + Extremities: no edema, no cyanosis   The results of significant diagnostics from this hospitalization (including imaging, microbiology, ancillary and laboratory) are listed below for reference.     Microbiology: Recent Results (from the past 240 hour(s))  Blood culture (routine x 2)     Status: None (Preliminary result)   Collection Time: 04/27/17 10:43 AM  Result Value Ref Range Status   Specimen Description BLOOD RIGHT ANTECUBITAL  Final   Special Requests   Final    BOTTLES DRAWN AEROBIC AND ANAEROBIC Blood Culture adequate volume   Culture   Final    NO GROWTH 2 DAYS Performed at [facility], [address] [place], [place] [address]    Report Status PENDING  Incomplete  Blood culture (routine x 2)     Status: None (Preliminary result)   Collection Time: 04/27/17 10:57 AM  Result Value Ref Range Status   Specimen Description BLOOD LEFT ANTECUBITAL  Final   Special Requests   Final    BOTTLES DRAWN AEROBIC AND ANAEROBIC Blood Culture adequate volume   Culture   Final    NO GROWTH 2 DAYS Performed at [facility], [place] [address] [employer] [place], [place] [address]    Report Status PENDING  Incomplete  Aerobic/Anaerobic Culture (surgical/deep wound)     Status: None (Preliminary result)   Collection Time: 04/28/17  3:28 PM  Result Value Ref Range Status   Specimen Description ABSCESS  Final   Special Requests ASPIRATION OF PRESUMED LEFT ILIOPSOAS HEMATOMA  Final   Gram Stain   Final    RARE WBC PRESENT, PREDOMINANTLY MONONUCLEAR NO ORGANISMS SEEN    Culture NO GROWTH < 24 HOURS  Final   Report Status PENDING  Incomplete     Labs: BNP (last 3 results) No results for input(s): BNP in the  last 8760 hours. Basic Metabolic Panel:  Recent Labs Lab 04/27/17 1043 04/29/17 0453  NA 139 139  K 3.2* 3.5  CL 102 105  CO2 27 29  GLUCOSE 136* 89  BUN 14 8  CREATININE 1.25* 0.99  CALCIUM 9.2 8.6*  MG  --  2.0   Liver Function Tests:  Recent Labs Lab 04/27/17 1043  AST 25  ALT 50  ALKPHOS 91  BILITOT 0.8  PROT 7.2  ALBUMIN 3.9   No results for input(s): LIPASE, AMYLASE in the last 168 hours. No results for input(s): AMMONIA in the last 168 hours. CBC:  Recent Labs Lab 04/27/17 1043 04/29/17 0453  WBC 16.6* 8.8  NEUTROABS 13.4* 5.8  HGB 17.9* 16.1  HCT 53.6* 46.1  MCV 84.1 87.8  PLT 194 123*   Cardiac Enzymes: No results for input(s): CKTOTAL, CKMB, CKMBINDEX, TROPONINI in the last 168 hours. BNP: Invalid input(s): POCBNP CBG: No results for input(s): GLUCAP in the last 168 hours. D-Dimer No results for input(s): DDIMER in the last 72 hours. Hgb A1c No results for input(s): HGBA1C in the last 72 hours. Lipid Profile No results for input(s): CHOL, HDL, LDLCALC, TRIG, CHOLHDL, LDLDIRECT in the last 72 hours. Thyroid function studies No results for input(s): TSH, T4TOTAL, T3FREE, THYROIDAB in the last 72 hours.  Invalid input(s): FREET3 Anemia work up No results for input(s): VITAMINB12, FOLATE, FERRITIN, TIBC, IRON, RETICCTPCT in the last 72 hours. Urinalysis    Component Value Date/Time   COLORURINE AMBER (A) 11/13/2014 1707   APPEARANCEUR CLEAR 11/13/2014 1707   LABSPEC 1.029 11/13/2014 1707   PHURINE 5.5 11/13/2014 1707   GLUCOSEU NEGATIVE 11/13/2014 1707   HGBUR NEGATIVE 11/13/2014 1707   BILIRUBINUR SMALL (A) 11/13/2014 1707   KETONESUR NEGATIVE 11/13/2014 1707   PROTEINUR NEGATIVE 11/13/2014 1707   UROBILINOGEN 1.0 11/13/2014 1707   NITRITE NEGATIVE 11/13/2014 1707   LEUKOCYTESUR NEGATIVE 11/13/2014 1707   Sepsis Labs Invalid input(s): PROCALCITONIN,  WBC,  LACTICIDVEN Microbiology Recent Results (from the past 240 hour(s))   Blood culture (routine x 2)     Status: None (Preliminary result)   Collection Time: 04/27/17 10:43 AM  Result Value Ref Range Status   Specimen Description BLOOD RIGHT ANTECUBITAL  Final   Special Requests   Final    BOTTLES DRAWN AEROBIC AND ANAEROBIC Blood Culture adequate volume   Culture   Final    NO GROWTH 2 DAYS Performed at [facility], [place] [address] [place], [place] [address]    Report Status PENDING  Incomplete  Blood culture (routine x 2)     Status: None (Preliminary result)   Collection Time: 04/27/17 10:57 AM  Result Value Ref Range Status   Specimen Description BLOOD LEFT ANTECUBITAL  Final   Special Requests   Final    BOTTLES DRAWN AEROBIC AND ANAEROBIC Blood Culture adequate volume   Culture   Final    NO GROWTH 2 DAYS Performed at [facility], [facility] [address], [place], [place] [address]    Report Status PENDING  Incomplete  Aerobic/Anaerobic Culture (surgical/deep wound)     Status: None (Preliminary result)   Collection Time: 04/28/17  3:28 PM  Result Value Ref Range Status   Specimen Description ABSCESS  Final   Special Requests ASPIRATION OF PRESUMED LEFT ILIOPSOAS HEMATOMA  Final   Gram Stain   Final    RARE WBC PRESENT, PREDOMINANTLY MONONUCLEAR NO ORGANISMS SEEN    Culture NO GROWTH < 24 HOURS  Final   Report Status PENDING  Incomplete    Time coordinating discharge: 32 minutes  SIGNED:  Chipper Oman, MD  [facility] 04/29/2017, 1:39 PM  Pager please text page via  [email or web address] Password [email or web address]

## 2017-04-30 ENCOUNTER — Telehealth: Payer: Self-pay | Admitting: Internal Medicine

## 2017-04-30 NOTE — Telephone Encounter (Signed)
Pt contacted and scheduled with [facility], 9/13.

## 2017-04-30 NOTE — Telephone Encounter (Signed)
I would say yes. Ideally would want patient to be seen in about 1 wee after discharge. Please arrange this for patient. If I am not available, okay to schedule with another resident for hospital follow up.

## 2017-04-30 NOTE — Telephone Encounter (Signed)
Pt is calling because he was released from the hospital today. He has made an appointment for 05/14/17 to see Dr. Dallas Schimke. He wants to make sure that this is okay and not to far out. Please call and let him know. jw

## 2017-04-30 NOTE — Telephone Encounter (Signed)
Will forward to MD to advise. Jazmin Hartsell,CMA  

## 2017-05-02 LAB — QUANTIFERON IN TUBE
QFT TB AG MINUS NIL VALUE: 0.09 [IU]/mL
QUANTIFERON NIL VALUE: 0.04 [IU]/mL
QUANTIFERON TB AG VALUE: 0.13 [IU]/mL
QUANTIFERON TB GOLD: NEGATIVE

## 2017-05-02 LAB — CULTURE, BLOOD (ROUTINE X 2)
Culture: NO GROWTH
Culture: NO GROWTH
SPECIAL REQUESTS: ADEQUATE
Special Requests: ADEQUATE

## 2017-05-02 LAB — QUANTIFERON TB GOLD ASSAY (BLOOD)

## 2017-05-03 LAB — AEROBIC/ANAEROBIC CULTURE W GRAM STAIN (SURGICAL/DEEP WOUND)

## 2017-05-03 LAB — AEROBIC/ANAEROBIC CULTURE (SURGICAL/DEEP WOUND): CULTURE: NO GROWTH

## 2017-05-06 ENCOUNTER — Telehealth: Payer: Self-pay | Admitting: *Deleted

## 2017-05-06 ENCOUNTER — Ambulatory Visit (INDEPENDENT_AMBULATORY_CARE_PROVIDER_SITE_OTHER): Payer: Medicaid Other | Admitting: Internal Medicine

## 2017-05-06 ENCOUNTER — Encounter: Payer: Self-pay | Admitting: Internal Medicine

## 2017-05-06 VITALS — BP 158/90 | HR 135 | Temp 98.3°F | Ht 70.0 in | Wt 256.0 lb

## 2017-05-06 DIAGNOSIS — K6812 Psoas muscle abscess: Secondary | ICD-10-CM | POA: Diagnosis present

## 2017-05-06 MED ORDER — OXYCODONE-ACETAMINOPHEN 5-325 MG PO TABS: 1.0000 | ORAL_TABLET | Freq: Three times a day (TID) | ORAL | 0 refills | Status: DC | PRN

## 2017-05-06 NOTE — Progress Notes (Signed)
   [facility] Phone: [phone]  Subjective:  Bryan Collins is a 51 year old male presenting to clinic for hospital follow-up. He was hospitalized from 04/27/17-04/29/17 with a left psoas abscess vs hematoma. He underwent CT and US guided aspiration on 04/28/17. ID was consulted and recommended treatment with Doxycycline bid and Levaquin 750mg  daily for 4 weeks. They also recommended repeat MRI in 3-4 weeks. Since discharge from the hospital, patient states that his pain has gotten "a little bit better". He still has "biting" and "strong" pain that starts in the left side of his lower back and radiates to his left leg. He has occasional "sweats" but no true fevers or chills. He was discharged home with Percocet 5-325 1-2 tablets, but has only been taking a 1/2 tablet for the last few days because he was worried he would run out of pain medications. States his pain is particularly bad today because he has only take 1/2 tablet all day. No nausea, no vomiting. He is taking the antibiotics as prescribed and is tolerating these without any issues.  ROS: See HPI for pertinent positives and negatives  Past Medical History- HTN, asthma, hx Barrett's esophagus, depression, anxiety, hx diverticulitis.  Family history reviewed for today's visit. No changes.  Social history- patient is a current smoker  Objective: BP (!) 158/90   Pulse (!) 135   Temp 98.3 F (36.8 C) (Oral)   Ht 5\' 10"  (1.778 m)   Wt 256 lb (116.1 kg)   SpO2 95%   BMI 36.73 kg/m  Gen: Alert, appears to be in moderate discomfort HEENT: NCAT, EOMI, MMM Resp: Normal work of breathing Msk: No edema, warm, normal tone, moves UE/LE spontaneously Neuro: Alert and oriented, walks with a limp  Assessment/Plan: Left Psoas Abscess: Recent discharge from the hospital. Reviewed hospital records. Thought to have abscess vs hematoma. Initially thought to be an abscess due to fevers and an elevated WBC count. IR only able to drain  ~1cc of bloody fluid and fluid analysis did not grow any organisms, so could potentially be a hematoma instead. Patient appears to be in moderate pain today and his BP and HR reflect that. He has only been taking 1/2 tab of Percocet to try to get them to last longer. Discussed with patient that he is underdosing himself. - Refill provided for Percocet #30. Advised that patient take 1-2 tablets as prescribed. - Recheck CBC and BMP to trend WBC count and monitor electrolytes - Continue 4 week course of Doxycycline and Levaquin - Repeat MRI recommended in 3-4 weeks by IR- this has been scheduled for 10/3. - Follow-up with PCP next week   Hyman Bible, MD PGY-3

## 2017-05-06 NOTE — Telephone Encounter (Signed)
Patient's mom called stating patient needed a prior authorization for his pain medication.  PA completed and approved via [place] [employer] until 11/02/2017. Approval number [id number]. Patient was able to pickup his medication.  Derl Barrow, RN

## 2017-05-06 NOTE — Patient Instructions (Signed)
It was so nice to meet you!  I am so sorry you are going through all of this. I have refilled your pain medications. I have also ordered some labs today. I will call you next week with these results.  We have scheduled an MRI for you and I will call you with that result as well.  -Dr. Brett Albino

## 2017-05-07 LAB — CBC
Hematocrit: 51.9 % — ABNORMAL HIGH (ref 37.5–51.0)
Hemoglobin: 17.8 g/dL — ABNORMAL HIGH (ref 13.0–17.7)
MCH: 31.2 pg (ref 26.6–33.0)
MCHC: 34.3 g/dL (ref 31.5–35.7)
MCV: 91 fL (ref 79–97)
PLATELETS: 186 10*3/uL (ref 150–379)
RBC: 5.7 x10E6/uL (ref 4.14–5.80)
RDW: 14.6 % (ref 12.3–15.4)
WBC: 13.3 10*3/uL — AB (ref 3.4–10.8)

## 2017-05-07 LAB — BASIC METABOLIC PANEL
BUN/Creatinine Ratio: 8 — ABNORMAL LOW (ref 9–20)
BUN: 8 mg/dL (ref 6–24)
CO2: 26 mmol/L (ref 20–29)
Calcium: 9.7 mg/dL (ref 8.7–10.2)
Chloride: 101 mmol/L (ref 96–106)
Creatinine, Ser: 1.06 mg/dL (ref 0.76–1.27)
GFR calc Af Amer: 93 mL/min/{1.73_m2} (ref >59)
GFR, EST NON AFRICAN AMERICAN: 80 mL/min/{1.73_m2} (ref >59)
Glucose: 146 mg/dL — ABNORMAL HIGH (ref 65–99)
POTASSIUM: 4.2 mmol/L (ref 3.5–5.2)
SODIUM: 145 mmol/L — AB (ref 134–144)

## 2017-05-07 NOTE — Assessment & Plan Note (Signed)
Recent discharge from the hospital. Reviewed hospital records. Thought to have abscess vs hematoma. Initially thought to be an abscess due to fevers and an elevated WBC count. IR only able to drain ~1cc of bloody fluid and fluid analysis did not grow any organisms, so could potentially be a hematoma instead. Patient appears to be in moderate pain today and his BP and HR reflect that. He has only been taking 1/2 tab of Percocet to try to get them to last longer. Discussed with patient that he is underdosing himself. - Refill provided for Percocet #30. Advised that patient take 1-2 tablets as prescribed. - Recheck CBC and BMP to trend WBC count and monitor electrolytes - Continue 4 week course of Doxycycline and Levaquin - Repeat MRI recommended in 3-4 weeks by IR- this has been scheduled for 10/3. - Follow-up with PCP next week

## 2017-05-10 ENCOUNTER — Telehealth: Payer: Self-pay | Admitting: *Deleted

## 2017-05-10 NOTE — Telephone Encounter (Signed)
Returned patient's call and discussed lab results. Patient voiced understanding.

## 2017-05-10 NOTE — Telephone Encounter (Signed)
Patient left message on nurse line requesting lab result from Dr. Brett Albino. Hubbard Hartshorn, RN, BSN

## 2017-05-11 ENCOUNTER — Other Ambulatory Visit: Payer: Self-pay | Admitting: *Deleted

## 2017-05-12 MED ORDER — AMLODIPINE BESYLATE 5 MG PO TABS: 5.0000 mg | ORAL_TABLET | Freq: Every day | ORAL | 3 refills | Status: DC

## 2017-05-12 NOTE — Telephone Encounter (Signed)
2nd request.  Martin, Tamika L, RN  

## 2017-05-13 DIAGNOSIS — R7303 Prediabetes: Secondary | ICD-10-CM | POA: Insufficient documentation

## 2017-05-13 NOTE — Progress Notes (Signed)
   [facility] Phone: [phone]   Date of Visit: 05/14/2017   HPI:  Left Groin Pain: - patient is here for follow up - early September, patient was admitted for sepsis. He was found to have myofasciitis and a hypointense fluid collection thought to be abscess vs hematoma. This was drained by IR which only resulted in cc of bloody fluid. The culture from this was negative. Patient was discharged with Doxycycline and Levaquin for 4 weeks.  - he has completed about 2 weeks of antibiotics. But  He has bee progressively feeling worse over time. He continues tohave pain in his leg which has eased up some. He has been having hot sweats. Has been having some nausea but no vomiting. Decreased PO intake. Intermittent lightheadedness/dizziness. No fevers at home.  - he also noticed a rash on his groin region for the past 3 days or so. It is pruritic.  - he has a scheduled repeat MRI pelvis on 10/3   ROS: See HPI.  [place]:  PMH: HTN Asthma IBS Barretts Esohpagus without dysplasia Tobacco Use DO MDD GAD Hx of colonic polyps Prediabetes    PHYSICAL EXAM: BP (!) 128/94   Pulse (!) 130   Temp 98.3 F (36.8 C) (Oral)   Ht 5\' 10"  (1.778 m)   Wt 250 lb (113.4 kg)   SpO2 98%   BMI 35.87 kg/m  GEN: nontoxic appearing  HEENT: dry mucous membrane CV: tachycardia, regular rhythm, no murmurs, rubs, or gallops PULM: CTAB, normal effort ABD: Soft, nontender, nondistended, NABS, no organomegaly GU: intermittent tenderness to palpation of the left inguinal region. There is a papular erythematous rash on the L > R groin that extends to the left abdomen (does not extend to the back)  EXTR: No lower extremity edema or calf tenderness PSYCH: Mood and affect euthymic, normal rate and volume of speech NEURO: Awake, alert, no focal deficits grossly, normal speech  ASSESSMENT/PLAN:  Rash: does not look infectious. More inflammatory/dermatitis.  - consider topical  steroid  History of Iliopsoas  Myofasciitis: He is feeling worse over time since his discharge despite antibiotics. Due to this, further evaluation is warranted in the inpatient setting, especially since it is Friday. Discussed with attending.  Would repeat imaging now. Patient is stable to go home and wait for bed.   Smiley Houseman, MD PGY [place]

## 2017-05-14 ENCOUNTER — Observation Stay (HOSPITAL_COMMUNITY)
Admission: AD | Admit: 2017-05-14 | Discharge: 2017-05-15 | Disposition: A | Discharge status: Home / Self Care | Payer: Medicaid Other | Source: Ambulatory Visit | Attending: Family Medicine | Admitting: Family Medicine

## 2017-05-14 ENCOUNTER — Ambulatory Visit (INDEPENDENT_AMBULATORY_CARE_PROVIDER_SITE_OTHER): Payer: Medicaid Other | Admitting: Internal Medicine

## 2017-05-14 ENCOUNTER — Encounter: Payer: Self-pay | Admitting: Internal Medicine

## 2017-05-14 VITALS — BP 128/94 | HR 130 | Temp 98.3°F | Ht 70.0 in | Wt 250.0 lb

## 2017-05-14 DIAGNOSIS — M79605 Pain in left leg: Secondary | ICD-10-CM | POA: Diagnosis present

## 2017-05-14 DIAGNOSIS — D751 Secondary polycythemia: Secondary | ICD-10-CM | POA: Diagnosis not present

## 2017-05-14 DIAGNOSIS — L309 Dermatitis, unspecified: Secondary | ICD-10-CM

## 2017-05-14 DIAGNOSIS — Z79899 Other long term (current) drug therapy: Secondary | ICD-10-CM | POA: Insufficient documentation

## 2017-05-14 DIAGNOSIS — R000 Tachycardia, unspecified: Secondary | ICD-10-CM | POA: Diagnosis not present

## 2017-05-14 DIAGNOSIS — R1032 Left lower quadrant pain: Secondary | ICD-10-CM | POA: Diagnosis not present

## 2017-05-14 DIAGNOSIS — J45909 Unspecified asthma, uncomplicated: Secondary | ICD-10-CM | POA: Insufficient documentation

## 2017-05-14 DIAGNOSIS — R21 Rash and other nonspecific skin eruption: Secondary | ICD-10-CM | POA: Insufficient documentation

## 2017-05-14 DIAGNOSIS — F17210 Nicotine dependence, cigarettes, uncomplicated: Secondary | ICD-10-CM | POA: Insufficient documentation

## 2017-05-14 DIAGNOSIS — Z23 Encounter for immunization: Secondary | ICD-10-CM | POA: Diagnosis not present

## 2017-05-14 DIAGNOSIS — R3129 Other microscopic hematuria: Secondary | ICD-10-CM | POA: Diagnosis not present

## 2017-05-14 DIAGNOSIS — M609 Myositis, unspecified: Secondary | ICD-10-CM | POA: Diagnosis not present

## 2017-05-14 DIAGNOSIS — E860 Dehydration: Secondary | ICD-10-CM | POA: Diagnosis not present

## 2017-05-14 DIAGNOSIS — F329 Major depressive disorder, single episode, unspecified: Secondary | ICD-10-CM | POA: Diagnosis not present

## 2017-05-14 DIAGNOSIS — N179 Acute kidney failure, unspecified: Secondary | ICD-10-CM | POA: Insufficient documentation

## 2017-05-14 DIAGNOSIS — M60852 Other myositis, left thigh: Principal | ICD-10-CM | POA: Insufficient documentation

## 2017-05-14 DIAGNOSIS — R1030 Lower abdominal pain, unspecified: Secondary | ICD-10-CM | POA: Insufficient documentation

## 2017-05-14 DIAGNOSIS — R7303 Prediabetes: Secondary | ICD-10-CM | POA: Insufficient documentation

## 2017-05-14 DIAGNOSIS — D72829 Elevated white blood cell count, unspecified: Secondary | ICD-10-CM | POA: Diagnosis not present

## 2017-05-14 DIAGNOSIS — K219 Gastro-esophageal reflux disease without esophagitis: Secondary | ICD-10-CM | POA: Insufficient documentation

## 2017-05-14 DIAGNOSIS — K580 Irritable bowel syndrome with diarrhea: Secondary | ICD-10-CM | POA: Diagnosis not present

## 2017-05-14 DIAGNOSIS — I10 Essential (primary) hypertension: Secondary | ICD-10-CM | POA: Insufficient documentation

## 2017-05-14 DIAGNOSIS — F411 Generalized anxiety disorder: Secondary | ICD-10-CM | POA: Diagnosis not present

## 2017-05-14 DIAGNOSIS — K6812 Psoas muscle abscess: Secondary | ICD-10-CM | POA: Diagnosis not present

## 2017-05-14 DIAGNOSIS — Z86010 Personal history of colonic polyps: Secondary | ICD-10-CM | POA: Diagnosis not present

## 2017-05-14 LAB — CBC
HCT: 52.6 % — ABNORMAL HIGH (ref 39.0–52.0)
HEMOGLOBIN: 18.4 g/dL — AB (ref 13.0–17.0)
MCH: 30.8 pg (ref 26.0–34.0)
MCHC: 35 g/dL (ref 30.0–36.0)
MCV: 88.1 fL (ref 78.0–100.0)
Platelets: 182 10*3/uL (ref 150–400)
RBC: 5.97 MIL/uL — ABNORMAL HIGH (ref 4.22–5.81)
RDW: 14.1 % (ref 11.5–15.5)
WBC: 14.1 10*3/uL — ABNORMAL HIGH (ref 4.0–10.5)

## 2017-05-14 LAB — BASIC METABOLIC PANEL
Anion gap: 11 (ref 5–15)
BUN: 10 mg/dL (ref 6–20)
CHLORIDE: 103 mmol/L (ref 101–111)
CO2: 26 mmol/L (ref 22–32)
CREATININE: 1.56 mg/dL — AB (ref 0.61–1.24)
Calcium: 9.5 mg/dL (ref 8.9–10.3)
GFR calc Af Amer: 57 mL/min — ABNORMAL LOW (ref >60)
GFR calc non Af Amer: 49 mL/min — ABNORMAL LOW (ref >60)
Glucose, Bld: 116 mg/dL — ABNORMAL HIGH (ref 65–99)
Potassium: 3.6 mmol/L (ref 3.5–5.1)
SODIUM: 140 mmol/L (ref 135–145)

## 2017-05-14 LAB — URINALYSIS, ROUTINE W REFLEX MICROSCOPIC
Bacteria, UA: NONE SEEN
Bilirubin Urine: NEGATIVE
GLUCOSE, UA: NEGATIVE mg/dL
Ketones, ur: 5 mg/dL — AB
Leukocytes, UA: NEGATIVE
NITRITE: NEGATIVE
Protein, ur: 100 mg/dL — AB
SPECIFIC GRAVITY, URINE: 1.025 (ref 1.005–1.030)
Squamous Epithelial / LPF: NONE SEEN
pH: 5 (ref 5.0–8.0)

## 2017-05-14 LAB — RAPID URINE DRUG SCREEN, HOSP PERFORMED
Amphetamines: NOT DETECTED
BARBITURATES: NOT DETECTED
Benzodiazepines: NOT DETECTED
Cocaine: NOT DETECTED
OPIATES: NOT DETECTED
TETRAHYDROCANNABINOL: POSITIVE — AB

## 2017-05-14 MED ORDER — HYDROCORTISONE 0.5 % EX CREA
TOPICAL_CREAM | Freq: Three times a day (TID) | CUTANEOUS | Status: DC | PRN
  Filled 2017-05-14 (×2): qty 28.35

## 2017-05-14 MED ORDER — OXYCODONE HCL 5 MG PO TABS
5.0000 mg | ORAL_TABLET | Freq: Four times a day (QID) | ORAL | Status: DC | PRN
  Administered 2017-05-14: 5 mg via ORAL
  Filled 2017-05-14: qty 1

## 2017-05-14 MED ORDER — CLONAZEPAM 0.5 MG PO TABS: 0.5000 mg | ORAL_TABLET | Freq: Four times a day (QID) | ORAL | Status: DC | PRN

## 2017-05-14 MED ORDER — ENSURE ENLIVE PO LIQD
237.0000 mL | Freq: Two times a day (BID) | ORAL | Status: DC
  Administered 2017-05-15: 237 mL via ORAL

## 2017-05-14 MED ORDER — INFLUENZA VAC SPLIT QUAD 0.5 ML IM SUSY
0.5000 mL | PREFILLED_SYRINGE | INTRAMUSCULAR | Status: AC
  Administered 2017-05-15: 0.5 mL via INTRAMUSCULAR
  Filled 2017-05-14: qty 0.5

## 2017-05-14 MED ORDER — LEVOFLOXACIN 750 MG PO TABS
750.0000 mg | ORAL_TABLET | Freq: Every day | ORAL | Status: DC
  Administered 2017-05-15: 750 mg via ORAL
  Filled 2017-05-14: qty 1

## 2017-05-14 MED ORDER — DOXYCYCLINE HYCLATE 50 MG PO CAPS
50.0000 mg | ORAL_CAPSULE | Freq: Two times a day (BID) | ORAL | Status: DC
  Administered 2017-05-15: 50 mg via ORAL
  Filled 2017-05-14 (×2): qty 1

## 2017-05-14 MED ORDER — PNEUMOCOCCAL VAC POLYVALENT 25 MCG/0.5ML IJ INJ
0.5000 mL | INJECTION | INTRAMUSCULAR | Status: AC
  Administered 2017-05-15: 0.5 mL via INTRAMUSCULAR
  Filled 2017-05-14: qty 0.5

## 2017-05-14 MED ORDER — PANTOPRAZOLE SODIUM 40 MG PO TBEC
40.0000 mg | DELAYED_RELEASE_TABLET | Freq: Every day | ORAL | Status: DC
  Administered 2017-05-15: 40 mg via ORAL
  Filled 2017-05-14: qty 1

## 2017-05-14 MED ORDER — AMLODIPINE BESYLATE 5 MG PO TABS
5.0000 mg | ORAL_TABLET | Freq: Every day | ORAL | Status: DC
  Administered 2017-05-15: 5 mg via ORAL
  Filled 2017-05-14: qty 1

## 2017-05-14 MED ORDER — FLUTICASONE FUROATE-VILANTEROL 200-25 MCG/INH IN AEPB
1.0000 | INHALATION_SPRAY | Freq: Every day | RESPIRATORY_TRACT | Status: DC
  Filled 2017-05-14: qty 28

## 2017-05-14 MED ORDER — SERTRALINE HCL 100 MG PO TABS
200.0000 mg | ORAL_TABLET | Freq: Every day | ORAL | Status: DC
  Administered 2017-05-15: 200 mg via ORAL
  Filled 2017-05-14: qty 2

## 2017-05-14 NOTE — H&P (Signed)
[facility] Admission History and Physical Service Pager: [phone]  Patient name: Bryan Collins Medical record number: [id number] Date of birth: [birth year] Age: 51 y.o. Gender: male  Primary Care Provider: Smiley Houseman, MD Consultants: none Code Status: FULL  Chief Complaint: back and left leg pain  Assessment and Plan: Bryan Collins is a 52 y.o. male presenting with worsening back pain, nausea, night sweats for 2 weeks after being treated for iliopsoas abscess inpatient earlier this month. PMH is significant for HTN, prediabetes, GERD, IBS, depression.  Myofascitis/abscess/sepsis Patient was admitted earlier this month for sepsis from unidentified source, thought to have been due to iliopsoas abscess visualized on MRI 04/27/17 but only able to drain ~1cc bloody fluid with no organism growth and thought instead to have been hematoma. On discharge, patient was continued on doxycycline and Levaquin for 4 weeks with ID in consult.  MRI to assess for discitis scheduled for 05/26/17. He presented to the clinic today with increasing back pain, night sweats, nausea with increases in HR and BP to 130, 128/94, respectively.  On admission, CBC notable for polycythemia as below, WBC 14, vitals with mild tachycardia to 107, slightly hypertensive, afebrile. No signs of sepsis, but will have low threshold to order lactic acid if VS changes. - admit to observation, Dr. Nori Riis attending - CBC - IV hydration - MRI pelvis  - blood culture - oxycodone 5mg  q6h PRN pain  Tachycardia: possible etiologies include poorly controlled pain, infection as above, ingestion, . Appears to have baseline tachycardia at well visits in the past (100s-110s). PCP did have question of carcinoid syndrome as of 06/2016 2/2 concomitant diarrhea and flushing symptoms. Unfortunately, urine 5HIAA testing could be falsely elevated with use of APAP, which the patient has been using for pain. No comment on  any possible tumors on recent CT and MRI abdominal imaging make carcinoid tumor less likely. Discussed imaging for carcinoid syndrome with Dr. Ilda Foil, who suggested that metastatic carcinoid causing flushing and diarrhea is unlikely without liver mets on recent CT. Formal diagnosis would require PET scan. Could also consider CT chest for possible more rare bronchial carcinoid tumor.  According to Up to Date, serum serotonin is not recommended as sensitivity and specificity are low. Recommended screening test is urine 5HIAA. However, this test can be falsely elevated in the setting of tylenol use. Will hold tylenol for 24 hours prior to ordering this urine study.  - U tox - assess infection as above - hold tylenol for urine 5 HIAA (plan for 9/23 am to start 24 hr collection) - TSH on am labs  Polycythemia: hx of elevated hemoglobin to baseline of ~18 since 2009. + ruddy complexion. Differential includes epo-producing neoplasms, hypoxia 2/2 long smoking history although no COPD diagnosis, undiagnosed OSA. Due to accompanying leukocytosis, could consider EPO level.  - peripheral smear  - CMP in am - CXR - UA (looking for microscopic hematuria in epo-secreting tumor)  Rash: patient with erythematous papular rash over bilateral inguinal creases. Nonpruitic, no open lesions. Exam consistent with contact dermatitis. Less likely but possible drug reaction. A[[eared 3 nights ago.  - topical hydrocortisone  HTN BP on admission elevated to 157/59. Home meds: amlodipine 5mg  daily. - continue home meds  GERD Home meds: Prilosec 40mg  BID, Pepto bismol as needed - continue home meds  IBS  Patient not able to adhere to [place]. Uses bentyl PRN.  -monitor symptoms  MDD/anxiety Home meds: Zoloft 100mg  BID, Klonopin 0.5mg  PRN. Patient  has not yet successfully set up a psych outpatient appt per chart review.  - continue home meds  FEN/GI: regular diet, home PPI Prophylaxis: SCDs should IR need to  drain collection again  Disposition: admit for observation  History of Present Illness:  Bryan Collins is a 51 y.o. male presenting from clinic with increasing back pain, night sweats, nausea.   Patient was admitted earlier this month for sepsis from unidentified source, thought to have been due to iliopsoas abscess but only able to drain ~1cc bloody fluid with no organism growth and thought instead to have been hematoma. On discharge, patient was continued on doxycycline and Levaquin for 4 weeks with ID in consult. He presented to the clinic today with increasing back pain, night sweats, nausea with increases in HR and BP to 130 and 128/94, respectively. Patient states he has been feeling unwell for a total of 33 days. Feels muscle spasms down his left leg. Has been feeling night sweats x 1 year. No fam hx of GI cancer. Feels like he has the flu. Has a history of diarrhea prior to narcotics for abscess, now 1-2 BMs per day.   Social - admits to EtOH use last several months ago, smoking 1-1.5ppd x many years, and occasional THC use. Also admits to taking some of his mom's klonipin when she ran out. Denies IV drug use.  Review Of Systems: Per HPI with the following additions: denies CP, SOB, changes in urination, fever.   ROS  Patient Active Problem List   Diagnosis Date Noted  . Groin pain, left 05/14/2017  . Prediabetes 05/13/2017  . 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 History of colonic polyps 02/10/2017  . Barrett's esophagus without dysplasia 02/10/2017  . Essential hypertension 06/25/2016  . IBS (irritable bowel syndrome) 06/16/2016  . Asthma, chronic 01/25/2015  . MDD (major depressive disorder) 01/25/2015  . GAD (generalized anxiety disorder) 01/25/2015  . Tobacco use disorder 01/25/2015  . Sebaceous cyst 01/25/2015  . Healthcare maintenance 01/25/2015    Past Medical History: Past Medical History:  Diagnosis Date  . Anxiety   . Asthma   . Barrett's esophagus   . Depression    . Diverticulitis   . Gastritis   . GERD (gastroesophageal reflux disease)   . Hiatal hernia   . HTN (hypertension)     Past Surgical History: Past Surgical History:  Procedure Laterality Date  . CHOLECYSTECTOMY      Social History: Social History  Substance Use Topics  . Smoking status: Current Every Day Smoker    Packs/day: 1.00    Years: 30.00    Types: Cigarettes    Start date: 01/22/1985  . Smokeless tobacco: Never Used  . Alcohol use 0.0 oz/week     Comment: occasional   Additional social history: lives with mom   Please also refer to relevant sections of EMR.  Family History: Family History  Problem Relation Age of Onset  . Breast cancer Mother   . Asthma Mother   . Gallstones Mother   . Heart attack Father   . Diabetes Father   . Prostate cancer Father   . Gallbladder disease Maternal Grandmother   . Stroke Maternal Grandmother   . Colon cancer Neg Hx   . Esophageal cancer Neg Hx   . Pancreatic cancer Neg Hx   . Rectal cancer Neg Hx   . Stomach cancer Neg Hx     Allergies and Medications: Allergies  Allergen Reactions  . Penicillins Hives  Pt's identical twin had reaction   No current facility-administered medications on file prior to encounter.    Current Outpatient Prescriptions on File Prior to Encounter  Medication Sig Dispense Refill  . ADVAIR DISKUS 250-50 MCG/DOSE AEPB INHALE 1 PUFF EVERY DAY 60 each 0  . albuterol (PROVENTIL HFA;VENTOLIN HFA) 108 (90 BASE) MCG/ACT inhaler Inhale 2 puffs into the lungs every 6 (six) hours as needed for wheezing or shortness of breath. 18 g 2  . amLODipine (NORVASC) 5 MG tablet Take 1 tablet (5 mg total) by mouth daily. 90 tablet 3  . bismuth subsalicylate (PEPTO BISMOL) 262 MG/15ML suspension Take 30 mLs by mouth every 6 (six) hours as needed for indigestion.    . clonazePAM (KLONOPIN) 0.5 MG tablet Take 1 tablet (0.5 mg total) by mouth every 6 (six) hours as needed for anxiety. (Patient taking differently:  Take 1 mg by mouth daily. ) 60 tablet 0  . doxycycline (VIBRAMYCIN) 50 MG capsule Take 1 capsule (50 mg total) by mouth 2 (two) times daily. 60 capsule 0  . levofloxacin (LEVAQUIN) 750 MG tablet Take 1 tablet (750 mg total) by mouth daily. 30 tablet 0  . omeprazole (PRILOSEC) 40 MG capsule Take 1 capsule (40 mg total) by mouth 2 (two) times daily before lunch and supper. 60 capsule 3  . oxyCODONE-acetaminophen (ROXICET) 5-325 MG tablet Take 1-2 tablets by mouth every 8 (eight) hours as needed for severe pain. 30 tablet 0  . sertraline (ZOLOFT) 100 MG tablet Take 2 tablets (200 mg total) by mouth daily. 60 tablet 0    Objective: BP (!) 157/59 (BP Location: Left Arm)   Pulse (!) 107   Temp 98 F (36.7 C)   Resp 18   Ht 5\' 10"  (1.778 m)   Wt 250 lb (113.4 kg)   SpO2 93%   BMI 35.87 kg/m  Exam: General: obese male sitting up in bed in NAD. Ruddy complexion. Eyes: PEERLA, EOMI ENTM: MMM, fair dentition Neck: no adenopathy (specifically supraclavicular nodes), no JVD Cardiovascular: RRR, no murmu Respiratory: CTAB, easy WOB Gastrointestinal: SNTND, no organomelagy, protuberant abdomen MSK: normal muscle tone and bulk, no pain to palpation over spine or paraspinal muscles Derm: erythematous papular rash over bilateral inguinal creases without pus or blistering Neuro: CN II-XII grossly intact Psych: anxious mood, affect appropriate  Labs and Imaging: CBC BMET   Recent Labs Lab 05/14/17 1830  WBC 14.1*  HGB 18.4*  HCT 52.6*  PLT 182   No results for input(s): NA, K, CL, CO2, BUN, CREATININE, GLUCOSE, CALCIUM in the last 168 hours.   MRI ordered.   Sela Hilding, MD 05/14/2017, 8:04 PM PGY-2, [place] Intern pager: [phone], text pages welcome

## 2017-05-15 ENCOUNTER — Observation Stay (HOSPITAL_COMMUNITY): Payer: Medicaid Other

## 2017-05-15 ENCOUNTER — Encounter (HOSPITAL_COMMUNITY): Payer: Self-pay

## 2017-05-15 DIAGNOSIS — R1032 Left lower quadrant pain: Secondary | ICD-10-CM | POA: Diagnosis not present

## 2017-05-15 DIAGNOSIS — R000 Tachycardia, unspecified: Secondary | ICD-10-CM | POA: Diagnosis not present

## 2017-05-15 DIAGNOSIS — M60852 Other myositis, left thigh: Secondary | ICD-10-CM | POA: Diagnosis not present

## 2017-05-15 DIAGNOSIS — D751 Secondary polycythemia: Secondary | ICD-10-CM | POA: Diagnosis not present

## 2017-05-15 LAB — COMPREHENSIVE METABOLIC PANEL
ALT: 23 U/L (ref 17–63)
AST: 17 U/L (ref 15–41)
Albumin: 3.5 g/dL (ref 3.5–5.0)
Alkaline Phosphatase: 71 U/L (ref 38–126)
Anion gap: 8 (ref 5–15)
BILIRUBIN TOTAL: 0.8 mg/dL (ref 0.3–1.2)
BUN: 10 mg/dL (ref 6–20)
CO2: 28 mmol/L (ref 22–32)
Calcium: 9.1 mg/dL (ref 8.9–10.3)
Chloride: 103 mmol/L (ref 101–111)
Creatinine, Ser: 1.42 mg/dL — ABNORMAL HIGH (ref 0.61–1.24)
GFR calc Af Amer: >60 mL/min (ref >60)
GFR, EST NON AFRICAN AMERICAN: 55 mL/min — AB (ref >60)
Glucose, Bld: 102 mg/dL — ABNORMAL HIGH (ref 65–99)
POTASSIUM: 3.5 mmol/L (ref 3.5–5.1)
Sodium: 139 mmol/L (ref 135–145)
TOTAL PROTEIN: 6.5 g/dL (ref 6.5–8.1)

## 2017-05-15 LAB — CBC
HCT: 48.9 % (ref 39.0–52.0)
HEMOGLOBIN: 17.2 g/dL — AB (ref 13.0–17.0)
MCH: 31.3 pg (ref 26.0–34.0)
MCHC: 35.2 g/dL (ref 30.0–36.0)
MCV: 88.9 fL (ref 78.0–100.0)
PLATELETS: 142 10*3/uL — AB (ref 150–400)
RBC: 5.5 MIL/uL (ref 4.22–5.81)
RDW: 14.5 % (ref 11.5–15.5)
WBC: 8.9 10*3/uL (ref 4.0–10.5)

## 2017-05-15 LAB — SAVE SMEAR

## 2017-05-15 LAB — TSH: TSH: 1.658 u[IU]/mL (ref 0.350–4.500)

## 2017-05-15 MED ORDER — GADOBENATE DIMEGLUMINE 529 MG/ML IV SOLN
20.0000 mL | Freq: Once | INTRAVENOUS | Status: AC | PRN
  Administered 2017-05-15: 20 mL via INTRAVENOUS

## 2017-05-15 MED ORDER — HYDROCORTISONE 0.5 % EX CREA: TOPICAL_CREAM | Freq: Two times a day (BID) | CUTANEOUS | 0 refills | Status: DC

## 2017-05-15 MED ORDER — SODIUM CHLORIDE 0.9 % IV SOLN
INTRAVENOUS | Status: AC
  Administered 2017-05-15: 03:00:00 via INTRAVENOUS

## 2017-05-15 NOTE — Progress Notes (Signed)
Family Medicine Teaching Service Daily Progress Note Intern Pager: [phone]  Patient name: Bryan Collins Medical record number: [id number] Date of birth: [birth year] Age: 51 y.o. Gender: male  Primary Care Provider: Smiley Houseman, MD Consultants: none Code Status: FULL  Pt Overview and Major Events to Date:  9/21: admit  Assessment and Plan: Myofascitis/abscess/sepsis: Leukocytosis resolved (likely hemoconcentration). MRI pelvis with much improved appearance of the left iliopsoas process - blood culture - Doxycycline 50mg  BID and Levaquin 750mg  daily  - oxycodone 5mg  q6h PRN pain - due to improvement, discharge today if able to ambulate fine.   AKI: Likely prerenal. UA with ketones. Clinically dehydrated appearing on admission.  - NS @ 100cc/hr x 12 hours (end 1PM today)   Tachycardia, resolved. Has chronically elevated HR in the low 100s as an outpatient. TSH normal. UDS positive for THC. PCP did have question of carcinoid syndrome as of 06/2016 2/2 concomitant diarrhea and flushing symptoms. - assess infection as above - hold tylenol for urine 5 HIAA (plan for 9/23 am to start 24 hr collection)  Polycythemia: hx of elevated hemoglobin to baseline of ~18 since 2009. + ruddy complexion. Differential includes epo-producing neoplasms, hypoxia 2/2 long smoking history although no COPD diagnosis, undiagnosed OSA. UA with microscopic hematuria (not chronic). CXR unremarkable.  - peripheral smear  - further work up as outpatient   Rash: patient with erythematous papular rash over bilateral inguinal creases. Nonpruitic, no open lesions. Exam consistent with contact dermatitis. Less likely but possible drug reaction. - topical hydrocortisone  HTN BP on admission elevated to 157/59. Home meds: amlodipine 5mg  daily. - continue home meds  GERD Home meds: Prilosec 40mg  BID, Pepto bismol as needed - continue home meds  IBS  Patient not able to adhere to [place]. Uses  bentyl PRN.  -monitor symptoms  MDD/anxiety Home meds: Zoloft 100mg  BID, Klonopin 0.5mg  PRN. Patient has not yet successfully set up a psych outpatient appt per chart review.  - continue home meds  FEN/GI: regular diet, home PPI Prophylaxis: SCDs should IR need to drain collection again  Disposition: discharge today if able to ambulate  Subjective:  Doing okay. Still feels sick overall, but better than yesterday. No pain  Objective: Temp:  [98 F (36.7 C)-98.6 F (37 C)] 98.6 F (37 C) (09/22 0428) Pulse Rate:  [98-130] 98 (09/22 0428) Resp:  [18] 18 (09/22 0428) BP: (97-157)/(59-94) 120/72 (09/22 0428) SpO2:  [93 %-99 %] 99 % (09/22 0428) Weight:  [249 lb 9 oz (113.2 kg)-250 lb (113.4 kg)] 249 lb 9 oz (113.2 kg) (09/22 0428) Physical Exam: General: NAD, nontoxic appearance Cardiovascular: RRR, no m/r/g Respiratory: normal effort, CTAB Abdomen: soft, NT, ND Extremities: no LE edema or calf tenderness  Laboratory:  Recent Labs Lab 05/14/17 1830 05/15/17 0454  WBC 14.1* 8.9  HGB 18.4* 17.2*  HCT 52.6* 48.9  PLT 182 142*    Recent Labs Lab 05/14/17 1830 05/15/17 0454  NA 140 139  K 3.6 3.5  CL 103 103  CO2 26 28  BUN 10 10  CREATININE 1.56* 1.42*  CALCIUM 9.5 9.1  PROT  --  6.5  BILITOT  --  0.8  ALKPHOS  --  71  ALT  --  23  AST  --  17  GLUCOSE 116* 102*    Imaging/Diagnostic Tests: MRI Pelvis:  IMPRESSION: Much improved appearance of the left iliopsoas process seen on the prior MRI. Minimal residual muscle edema but no abscess, pyomyositis, septic arthritis or osteomyelitis.  CXR: neg   Smiley Houseman, MD 05/15/2017, 8:25 AM PGY-3, [place] Intern pager: [phone], text pages welcome

## 2017-05-15 NOTE — Discharge Summary (Signed)
///Family [facility] Discharge Summary  Patient name: Bryan Collins Medical record number: [id number] Date of birth: [birth year] Age: 51 y.o. Gender: male Date of Admission: 05/14/2017  Date of Discharge: 05/15/17 Admitting Physician: Dickie La, MD  Primary Care Provider: Smiley Houseman, MD Consultants: none  Indication for Hospitalization: worsening groin pain  Discharge Diagnoses/Problem List:  Worsening pain in the setting of Myofasciitis of left iliopsoas muscle (diagnosed during an admission earlier this month)  Disposition: home  Discharge Condition: improved, stable   Discharge Exam:  General: NAD, nontoxic appearance Cardiovascular: RRR, no m/r/g Respiratory: normal effort, CTAB Abdomen: soft, NT, ND Extremities: no LE edema or calf tenderness  Brief Hospital Course:  [name] is a 51 year old male with recent admission and discharge for myositis. Discharged on 4 weeks of oral antibiotics. Has also had ongoing facial flushing, sweating, intermittent diarrhea for the past months to year. At his hospital follow-up on Friday he was feeling poorly. There is concern for possible worsening of his myositis and therefore he was admitted to the hospital. MRI showed improvement in his fluid collection and muscular edema. He was continued on Doxycycline and Levaquin to complete the 4 week course. On admission, his creatinine was slightly elevated; this was thought to be due to decreased PO intake. This improved hydration.   He also was noted to have a rash on his groin and abdomen which was consistent with contact dermatitis. He was started on topical hydrocortisone.   He will need continued work up of possible carcinoid.   Issues for Follow Up:  - outpatient work up of possible carcinoid.   Significant Procedures: none  Significant Labs and Imaging:   Recent Labs Lab 05/14/17 1830 05/15/17 0454  WBC 14.1* 8.9  HGB 18.4* 17.2*  HCT 52.6* 48.9   PLT 182 142*    Recent Labs Lab 05/14/17 1830 05/15/17 0454  NA 140 139  K 3.6 3.5  CL 103 103  CO2 26 28  GLUCOSE 116* 102*  BUN 10 10  CREATININE 1.56* 1.42*  CALCIUM 9.5 9.1  ALKPHOS  --  71  AST  --  17  ALT  --  23  ALBUMIN  --  3.5   TSH: 1.658 UA: small hgb, 5 ketones, 100 protein    Results/Tests Pending at Time of Discharge: blood culture   Discharge Medications:  Allergies as of 05/15/2017      Reactions   Penicillins Hives   Pt's identical twin had reaction      Medication List    TAKE these medications   ADVAIR DISKUS 250-50 MCG/DOSE Aepb Generic drug:  Fluticasone-Salmeterol INHALE 1 PUFF EVERY DAY   albuterol 108 (90 Base) MCG/ACT inhaler Commonly known as:  PROVENTIL HFA;VENTOLIN HFA Inhale 2 puffs into the lungs every 6 (six) hours as needed for wheezing or shortness of breath.   amLODipine 5 MG tablet Commonly known as:  NORVASC Take 1 tablet (5 mg total) by mouth daily.   bismuth subsalicylate 132 GM/01UU suspension Commonly known as:  PEPTO BISMOL Take 30 mLs by mouth every 6 (six) hours as needed for indigestion.   clonazePAM 0.5 MG tablet Commonly known as:  KLONOPIN Take 1 tablet (0.5 mg total) by mouth every 6 (six) hours as needed for anxiety. What changed:  how much to take  when to take this   doxycycline 50 MG capsule Commonly known as:  VIBRAMYCIN Take 1 capsule (50 mg total) by mouth 2 (two) times daily.  hydrocortisone cream 0.5 % Apply topically 2 (two) times daily. Apply to rash for 1 week.   levofloxacin 750 MG tablet Commonly known as:  LEVAQUIN Take 1 tablet (750 mg total) by mouth daily.   omeprazole 40 MG capsule Commonly known as:  PRILOSEC Take 1 capsule (40 mg total) by mouth 2 (two) times daily before lunch and supper.   oxyCODONE-acetaminophen 5-325 MG tablet Commonly known as:  ROXICET Take 1-2 tablets by mouth every 8 (eight) hours as needed for severe pain.   sertraline 100 MG  tablet Commonly known as:  ZOLOFT Take 2 tablets (200 mg total) by mouth daily.            Discharge Care Instructions        Start     Ordered   05/15/17 0000  hydrocortisone cream 0.5 %  2 times daily     05/15/17 1329   05/15/17 0000  Increase activity slowly     05/15/17 1331   05/15/17 0000  Diet - low sodium heart healthy     05/15/17 1331      Discharge Instructions: Please refer to Patient Instructions section of EMR for full details.  Patient was counseled important signs and symptoms that should prompt return to medical care, changes in medications, dietary instructions, activity restrictions, and follow up appointments.   Follow-Up Appointments: Follow-up Information    Smiley Houseman, MD Follow up on 05/27/2017.   Specialty:  Family Medicine Why:  at 10:50AM for hospital follow up   Contact information: [place] [address] [phone]           Smiley Houseman, MD 05/15/2017, 1:32 PM PGY-3, [place]

## 2017-05-15 NOTE — Discharge Instructions (Signed)
We got an MRI and it shows that the inflammation is improving. Please continue your antibiotics as prescribed. Please take your pain medication if you need it. You can always call me and let me know if you need more. Your kidney number was up likely due to decreased fluid intake. Make sure you are keeping yourself hydrated. I prescribed a steroid cream for your rash. Apply this twice a day for a week.

## 2017-05-15 NOTE — Progress Notes (Signed)
Initial Nutrition Assessment  DOCUMENTATION CODES:  Obesity unspecified  INTERVENTION:  Continue Ensure Enlive po BID, each supplement provides 350 kcal and 20 grams of protein  NUTRITION DIAGNOSIS:  Unintentional weight loss related to Unknown etiology (potentially decreased appetite vs acute illness vs underlying chronic illness as evidenced by wt loss of 10% bw in 3 months.  GOAL:  Patient will meet greater than or equal to 90% of their needs  MONITOR:  PO intake, Supplement acceptance, Labs, Weight trends  REASON FOR ASSESSMENT:  Malnutrition Screening Tool    ASSESSMENT:  51 y/o male PMHx HTN, IBS, Depression/Anxiety. Presented with worsening back pain, nausea and night sweats for 2 weeks after treatment for iliopsoas abscess earlier this month. Worked up for polycythemia, tachycardia, elevated WBC. Admitted for further workup.  Pt seen secondary to reported weight loss and poor appetite. Today, he states that he has been losing weight, without any significant changes in his appetite. When asked how much weight he has lost, he rpeorted >20 lbs. He does not know his UBW. Per chart review, the patient was indeed weighing 277 lbs in June of this year. He is now 249 lbs, a loss of 10% or nearly 30 lbs. His UBW appears to be between 260-270 lbs.   RD asked about any changes that have occurred since June. He says he has had overall malaise and decreased appetite since early September, which he attributes to his abscess. However, he says that his overall ill-feeling has not had almost any impact on his PO intake. He does not know why he is losing weight.   Denies n/v/c/d. He does not take any supplements or vitamins at home.   Suspect patient's weight loss due to increased requirements r/t infection, his poor appetite affecting his intake more then he realizes or other condition. From provider notes, Appears undiagnosed cancer is also being considered.   At this time, patient still has  a poor appetite. He is agreeable to continuing ENsure  NFPE: obese, wdl  Labs: Creat elevated to 1.42, Polycythemia  Meds: Ensure, ABx, ppi,    Recent Labs Lab 05/14/17 1830 05/15/17 0454  NA 140 139  K 3.6 3.5  CL 103 103  CO2 26 28  BUN 10 10  CREATININE 1.56* 1.42*  CALCIUM 9.5 9.1  GLUCOSE 116* 102*   Diet Order:  Diet Heart Room service appropriate? Yes; Fluid consistency: Thin Diet - low sodium heart healthy  Skin: Rash to Abdomen, groin, arm, thigh   Last BM:  9/20  Height:  Ht Readings from Last 1 Encounters:  05/14/17 5\' 10"  (1.778 m)   Weight:  Wt Readings from Last 1 Encounters:  05/15/17 249 lb 9 oz (113.2 kg)   Wt Readings from Last 10 Encounters:  05/15/17 249 lb 9 oz (113.2 kg)  05/14/17 250 lb (113.4 kg)  05/06/17 256 lb (116.1 kg)  04/27/17 261 lb 11.2 oz (118.7 kg)  04/19/17 275 lb (124.7 kg)  02/12/17 277 lb (125.6 kg)  02/10/17 277 lb 4 oz (125.8 kg)  12/22/16 284 lb (128.8 kg)  06/25/16 268 lb (121.6 kg)  06/16/16 262 lb (118.8 kg)   Ideal Body Weight:  75.45 kg  BMI:  Body mass index is 35.81 kg/m.  Estimated Nutritional Needs:  Kcal:  1800-2050 kcals (16-18 kcal/kg bw) Protein:  90-105g Pro (1.2-1.4 g/kg ibw) Fluid:  1.8-2 L fluid  EDUCATION NEEDS:  No education needs identified at this time  Burtis Junes RD, LDN, [place] [facility] Pager:  [phone] 05/15/2017 2:59 PM

## 2017-05-15 NOTE — Progress Notes (Signed)
Bryan Collins to be D/C'd Home per MD order.  Discussed with the patient and all questions fully answered.  VSS, Skin clean, dry and intact without evidence of skin break down, no evidence of skin tears noted. IV catheter discontinued intact. Site without signs and symptoms of complications. Dressing and pressure applied.  An After Visit Summary was printed and given to the patient. Patient received prescription.  D/c education completed with patient/family including follow up instructions, medication list, d/c activities limitations if indicated, with other d/c instructions as indicated by MD - patient able to verbalize understanding, all questions fully answered.   Patient instructed to return to ED, call 911, or call MD for any changes in condition.   Patient escorted via [place], and D/C home via private auto.  [name] 05/15/2017 2:56 PM

## 2017-05-20 ENCOUNTER — Other Ambulatory Visit: Payer: Self-pay | Admitting: Internal Medicine

## 2017-05-20 LAB — CULTURE, BLOOD (ROUTINE X 2)
CULTURE: NO GROWTH
Culture: NO GROWTH
SPECIAL REQUESTS: ADEQUATE
Special Requests: ADEQUATE

## 2017-05-20 NOTE — Telephone Encounter (Signed)
Pt called because he is running low on pain medication and would like the doctor to call some more in. jw

## 2017-05-21 MED ORDER — OXYCODONE-ACETAMINOPHEN 5-325 MG PO TABS: 1.0000 | ORAL_TABLET | Freq: Three times a day (TID) | ORAL | 0 refills | Status: DC | PRN

## 2017-05-21 NOTE — Telephone Encounter (Signed)
Patient is aware that script is ready for pick up and his mom will come by and get it. Jazmin Hartsell,CMA

## 2017-05-21 NOTE — Telephone Encounter (Signed)
Please inform patient that prescription is placed up front for pick up. ( I cannot call in opioid medications).

## 2017-05-25 ENCOUNTER — Other Ambulatory Visit: Payer: Self-pay | Admitting: Internal Medicine

## 2017-05-26 ENCOUNTER — Ambulatory Visit (HOSPITAL_COMMUNITY): Payer: Medicaid Other | Attending: Family Medicine

## 2017-05-27 ENCOUNTER — Encounter: Payer: Self-pay | Admitting: Internal Medicine

## 2017-05-27 ENCOUNTER — Ambulatory Visit (INDEPENDENT_AMBULATORY_CARE_PROVIDER_SITE_OTHER): Payer: Medicaid Other | Admitting: Internal Medicine

## 2017-05-27 VITALS — BP 110/60 | HR 127 | Temp 99.0°F | Ht 70.0 in | Wt 249.2 lb

## 2017-05-27 DIAGNOSIS — Z122 Encounter for screening for malignant neoplasm of respiratory organs: Secondary | ICD-10-CM

## 2017-05-27 DIAGNOSIS — R232 Flushing: Secondary | ICD-10-CM | POA: Diagnosis not present

## 2017-05-27 DIAGNOSIS — R634 Abnormal weight loss: Secondary | ICD-10-CM

## 2017-05-27 DIAGNOSIS — Z09 Encounter for follow-up examination after completed treatment for conditions other than malignant neoplasm: Secondary | ICD-10-CM

## 2017-05-27 DIAGNOSIS — R5383 Other fatigue: Secondary | ICD-10-CM

## 2017-05-27 DIAGNOSIS — K6812 Psoas muscle abscess: Secondary | ICD-10-CM

## 2017-05-27 DIAGNOSIS — Z23 Encounter for immunization: Secondary | ICD-10-CM

## 2017-05-27 DIAGNOSIS — F411 Generalized anxiety disorder: Secondary | ICD-10-CM | POA: Diagnosis not present

## 2017-05-27 DIAGNOSIS — R0683 Snoring: Secondary | ICD-10-CM

## 2017-05-27 MED ORDER — POLYETHYLENE GLYCOL 3350 17 GM/SCOOP PO POWD: 17.0000 g | Freq: Every day | ORAL | 1 refills | Status: DC | PRN

## 2017-05-27 MED ORDER — CLONAZEPAM 0.5 MG PO TABS: 0.5000 mg | ORAL_TABLET | Freq: Four times a day (QID) | ORAL | 0 refills | Status: DC | PRN

## 2017-05-27 NOTE — Patient Instructions (Addendum)
We will get imaging of your chest We will get a urine test  I prescribed Miralax as needed for constipation  I ordered a sleep study. They will call you to schedule this.

## 2017-05-27 NOTE — Progress Notes (Signed)
[facility] Phone: [phone]   Date of Visit: 05/27/2017   HPI:  Hospital Follow Up: - early September, patient was admitted for sepsis. He was found to have myofasciitis and a hypointense fluid collection thought to be abscess vs hematoma. This was drained by IR which only resulted in 1cc of bloody fluid. The culture from this was negative. Patient was discharged with Doxycycline and Levaquin for 4 weeks.  - patient returned to his initial hospital follow up and reported he seemed to be feeling worse. Therefore, he was readmitted for repeat imaging on 9/21 which showed improvement of swelling of the same area. He was discharged with instructions to continue his antibiotics.  - reports he is getting better but improvement is slow. He still has some pain in his left groin but slowly improving.   Unintentional Weight Loss/Fatigue/Flushing:  - patient reports of unintentional weight loss. From 261lb on 04/27/17 to 249 on 05/27/17. Reports he has decreased appetite.  - he reports he has had flushing on the upper torso and head for at least 1 year. This initially happened when he ate a eal and when he went to the bathroom. Now, it is happening more often and throughout the day.  - he reported of some diarrhea a while ago but that has not been persistent. It only occurs about 2-3 times a month - he has a history of tobacco use and history of barrett's esophagus. hHe reports of continued "knawing" pain in the middle of his abdomen for the past 10 moths. Symptoms are stable. No aggravating effects. Sometimes alleviated by pepto bismol. Is taking Prilosec but does not seem to be helping. His last endoscopy was 01/2017 with surgical pathology consistent with barrett's esophagus. GI wanted patient to follow up in 3 months (04/2017). Patient has not made appointment yet due to the recent hospitalizations. Patient to make appointment.  - Colonoscopy done in 2017 with 2mm polyp removed;  surgical pathology consistent with hyperplastic polyp.   ROS: See HPI.  [place]:  PMH: HTN Asthma IBS Barretts Esophagus Tobacco Use  Prediabetes  Polycythemia GAD    PHYSICAL EXAM: BP 110/60 (BP Location: Left Arm, Patient Position: Sitting, Cuff Size: Large)   Pulse (!) 127   Temp 99 F (37.2 C) (Oral)   Ht 5\' 10"  (1.778 m)   Wt 249 lb 3.2 oz (113 kg)   SpO2 93%   BMI 35.76 kg/m  Gen: NAD, nontoxic, anxious appearing HEENT: PERRL, sclerae clear, moist mucous membranes, normal thyroid. [place] hump noted.  Heart: RRR. No m/r/g Lungs: normal effort, CTAB Abdomen: abdominal obesity, soft, no significant tenderness, no guarding or rebound. + bowel sounds Neuro: grossly intact  Ext: no swelling of the lower extremities  ASSESSMENT/PLAN:  Inflammation of the Left Iliopsoas Muscle: Slowly improving. Continue current course of antibiotics. Miralax PRN for constipation due to opioid use.   Unintentional Weight Loss/Fatigue/Flushing: Currently unclear in etiology. Differentials include malignancy. He has history of Barrett's esophagus and current tobacco use. He is due to follow up by GI; we discussed this. With his smoking history and his current symptoms, will order CT chest to evaluate for lung malignancy. Has come characteristics of [place]. Will obtain 24 hr urine 5HIAA. Follow up pending lab results. If labs are unremarkable, will likely refer to endocrinology for further evaluation. He also reports of symptoms of OSA. Will order sleep study.   Anxiety: patient has still not established care with psychiatry due to hospitalizations. He asked if  I could refill his PRN benzodiazepine once more. I will prescribe once more. Will call patient and discuss at least starting Buspar.    Smiley Houseman, MD PGY [place]

## 2017-06-01 ENCOUNTER — Telehealth: Payer: Self-pay | Admitting: Internal Medicine

## 2017-06-01 MED ORDER — BUSPIRONE HCL 5 MG PO TABS: 5.0000 mg | ORAL_TABLET | Freq: Two times a day (BID) | ORAL | 0 refills | Status: DC

## 2017-06-01 NOTE — Telephone Encounter (Signed)
Called patient to discuss starting Buspar to better control anxiety and decrease the need for PRN Benzo. Patient agreeable. Start Buspar 5mg  BID.

## 2017-06-02 MED ORDER — BUSPIRONE HCL 7.5 MG PO TABS: 7.5000 mg | ORAL_TABLET | Freq: Two times a day (BID) | ORAL | 0 refills | Status: DC

## 2017-06-02 NOTE — Telephone Encounter (Signed)
Rx sent for Buspar 7.5mg  BID (pharmacy note sent to discontinue 5mg  BID since this is on backorder)

## 2017-06-02 NOTE — Addendum Note (Signed)
Addended by: Smiley Houseman on: 06/02/2017 03:50 PM   Modules accepted: Orders

## 2017-06-02 NOTE — Addendum Note (Signed)
Addended by: Francene Castle on: 06/02/2017 03:32 PM   Modules accepted: Orders

## 2017-06-02 NOTE — Telephone Encounter (Signed)
Received fax from [facility] stating Buspirone 5 mg is on back order. They have Buspar 7.5 mg and 10 mg dose available.  Derl Barrow, RN

## 2017-06-03 ENCOUNTER — Ambulatory Visit (HOSPITAL_COMMUNITY)
Admission: RE | Admit: 2017-06-03 | Discharge: 2017-06-03 | Disposition: A | Discharge status: Home / Self Care | Payer: Medicaid Other | Source: Ambulatory Visit | Attending: Family Medicine | Admitting: Family Medicine

## 2017-06-03 DIAGNOSIS — Z122 Encounter for screening for malignant neoplasm of respiratory organs: Secondary | ICD-10-CM

## 2017-06-03 DIAGNOSIS — J439 Emphysema, unspecified: Secondary | ICD-10-CM | POA: Insufficient documentation

## 2017-06-04 ENCOUNTER — Telehealth: Payer: Self-pay | Admitting: Internal Medicine

## 2017-06-04 NOTE — Telephone Encounter (Signed)
Called patient regarding negative CT chest. Asked about pain medication refill. He is okay with OTC extra strength tylenol for now.

## 2017-06-06 LAB — 5 HIAA, QUANTITATIVE, URINE, 24 HOUR
5-HIAA, Ur: 2.5 mg/L
5-HIAA,QUANT.,24 HR URINE: 2.5 mg/(24.h) (ref 0.0–14.9)

## 2017-06-07 ENCOUNTER — Telehealth: Payer: Self-pay | Admitting: Internal Medicine

## 2017-06-07 DIAGNOSIS — R3129 Other microscopic hematuria: Secondary | ICD-10-CM

## 2017-06-07 DIAGNOSIS — R232 Flushing: Secondary | ICD-10-CM

## 2017-06-07 DIAGNOSIS — M609 Myositis, unspecified: Secondary | ICD-10-CM

## 2017-06-07 MED ORDER — TRAMADOL HCL 50 MG PO TABS: 50.0000 mg | ORAL_TABLET | Freq: Four times a day (QID) | ORAL | 0 refills | Status: DC | PRN

## 2017-06-07 NOTE — Telephone Encounter (Signed)
Called patient to report of normal urine 5HIAA. It is unclear why he continues to have episodes of flushing. The next step would be referral to endocrinology for further evaluation. He agrees with this.   Also, there is no clear cause for his unintentional weight loss. CT chest is negative (with his history of smoking), colonoscopy in 2017 was fine. He does have history of barrett's esophagus but is following GI regularly. Per chart review, his UA showed microscopic hematuria. As noted before, he does use tobacco. Will repeat UA to see if this is persistent microscopic hematuria as his prior UAs were all normal. Patient to make a lab visit.    Patient reports that his thigh pain is still persistent and severe. Tylenol and Ibuprofen is not helping. Will prescribe Tramadol PRN. I will place Rx on fax box.   Patient to see me in two weeks.

## 2017-06-15 ENCOUNTER — Other Ambulatory Visit: Payer: Self-pay | Admitting: Gastroenterology

## 2017-06-21 ENCOUNTER — Other Ambulatory Visit (INDEPENDENT_AMBULATORY_CARE_PROVIDER_SITE_OTHER): Payer: Medicaid Other

## 2017-06-21 DIAGNOSIS — R3129 Other microscopic hematuria: Secondary | ICD-10-CM | POA: Diagnosis not present

## 2017-06-21 LAB — POCT URINALYSIS DIP (MANUAL ENTRY)
Glucose, UA: NEGATIVE mg/dL
Leukocytes, UA: NEGATIVE
Nitrite, UA: NEGATIVE
Protein Ur, POC: 30 mg/dL — AB
RBC UA: NEGATIVE
SPEC GRAV UA: 1.025 (ref 1.010–1.025)
Urobilinogen, UA: 0.2 E.U./dL
pH, UA: 5.5 (ref 5.0–8.0)

## 2017-06-22 ENCOUNTER — Other Ambulatory Visit: Payer: Self-pay | Admitting: Internal Medicine

## 2017-07-02 ENCOUNTER — Other Ambulatory Visit: Payer: Self-pay | Admitting: Internal Medicine

## 2017-07-05 NOTE — Progress Notes (Signed)
[facility] Phone: [phone]   Date of Visit: 07/06/2017   HPI:  Anxiety/depression: -Medications: Zoloft 200 mg daily, BuSpar 7.5 mg twice daily -Reports that him feeling well overall has worsened his symptoms of anxiety and depression. We are in the process of working up his chronic fatigue and generally feeling unwell. -Gad 7 score of 21, extremely difficult; PHQ 9 score of 23, extremely difficult.  Question #9 answer not at all.   -We have been discussing that he needs to find a psychiatrist.  He reports that this was delayed due to him not feeling well and being admitted about 2 months ago for his inflammation of his iliopsoas muscle.  Discussed the importance of getting this done as this might be contributing to him fatigued and not himself. -Reports that he has seen a therapist in the past.  Is not interested in seeing behavioral health therapist here. -When he was first started on Zoloft deftly help with his symptoms.  He has been on BuSpar for about 2 weeks and has not noticed any change.   Left groin pain: -This is been a chronic issue since September when he was initially admitted to the hospital for this.  His MRI at that time was concerning for possible abscess near the iliopsoas muscle on the left.  However when IR attempted to drain this only a small amount of blood returned so they thought it was a hematoma instead.  Regardless, he completed a course of antibiotics.  He had a repeat imaging done at the end of November which showed significant improvement of the inflammation of the iliopsoas muscle.  There were no signs of abscess or osteomyelitis or myositis.  However he continued to have this left-sided groin pain that is not relieved by Tylenol or ibuprofen.  He required as needed tramadol for his pain. -He describes this pain is sometimes sharp and sometimes burning like sensation.  He denies any weakness of his lower extremities.  He denies any  numbness of his lower extremities.  He reports of mild back pain that is not really bothering him.  Denies any urinary retention or incontinence.  Denies any bowel incontinence.  ROS: See HPI.  [place]:  PMH: HTN Asthma IBS Barrett's esophagus without dysplasia Tobacco Use DO Prediabetes Tachycardia Polycythemia MDD Anxiety  PHYSICAL EXAM: BP 126/84   Pulse 98   Temp 98.7 F (37.1 C) (Oral)   Wt 249 lb (112.9 kg)   SpO2 98%   BMI 35.73 kg/m  GEN: NAD, nontoxic appearing, pleasant  CV: RRR, no murmurs, rubs, or gallops PULM: CTAB, normal effort ABD: Soft, nontender, nondistended, NABS, no organomegaly GU: Scrotal exam is normal.  No tenderness to palpation of the inguinal region or thigh region. MSK: No tenderness to palpation of the lower back or paraspinal muscles.  No tenderness to palpation of the greater trochanter bilaterally.  No tenderness to palpation of his thigh muscles.  Reports of normal sensation to light touch bilaterally in lower extremities.  5 out of 5 strength in the lower extremities.  Normal range of motion of the left hip without any reproduction of the pain especially with internal rotation of the hip. SKIN: No rash or cyanosis; warm and well-perfused EXTR: No lower extremity edema or calf tenderness PSYCH: Mood and affect euthymic, normal rate and volume of speech NEURO: Awake, alert, no focal deficits grossly, normal speech  ASSESSMENT/PLAN:  Health maintenance: -Patient is fasting therefore will obtain a lipid panel  Anxiety/depression:  Symptoms are uncontrolled.  Likely exacerbated by overall feeling unwell and his left groin pain.  We discussed the importance of getting a psychiatrist.  We also discussed options about increasing the dose of BuSpar versus keeping at the same and allowing medication a few more weeks to see if there is any improvement.  Patient opted to increase dose.  We dissected increase BuSpar to 7.5 mg 3 times daily.  Continue  Zoloft 200 mg daily.  Patient to call behavioral behavioral health at our clinic.  I will also see if we can get him in mood clinic.  Left groin pain: Unlikely this is hip joint pathology as he does not have any reproducible pain with range of motion of the hip.  His testicular exam was unremarkable and not likely contributing to this left groin pain.  It is odd that his iliopsoas muscle inflammation is still causing this pain.  MRI obtained in 9/22 showed resolving inflammation of his iliopsoas muscle.  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  Precepted with Dr. Alease Frame.  Will do trial of gabapentin 100 mg initially at bedtime and slowly increase to 100 mg 3 times daily if tolerating well.  Follow-up in about a month.  -BMP to check his creatinine as he reports of taking a lot of ibuprofen.  I cautioned him about taking this medication.  Fatigue: Patient still hesitant to schedule a sleep study because of "all things that have been going on with my health".  I explained to him that may be some of his fatigue is contributed by possible OSA and if the test is positive for OSA we can treat this and this may help with his symptoms.  Patient is unsure if he snores in his sleep, but he does have daytime fatigue and morning headaches at times.  -We will reorder a sleep study as prior study has expired -We will also obtain a vitamin D level   Smiley Houseman, MD PGY  [place]

## 2017-07-06 ENCOUNTER — Other Ambulatory Visit: Payer: Self-pay

## 2017-07-06 ENCOUNTER — Encounter: Payer: Self-pay | Admitting: Internal Medicine

## 2017-07-06 ENCOUNTER — Ambulatory Visit: Payer: Medicaid Other | Admitting: Internal Medicine

## 2017-07-06 VITALS — BP 126/84 | HR 98 | Temp 98.7°F | Wt 249.0 lb

## 2017-07-06 DIAGNOSIS — E785 Hyperlipidemia, unspecified: Secondary | ICD-10-CM | POA: Diagnosis not present

## 2017-07-06 DIAGNOSIS — S76912D Strain of unspecified muscles, fascia and tendons at thigh level, left thigh, subsequent encounter: Secondary | ICD-10-CM | POA: Diagnosis not present

## 2017-07-06 DIAGNOSIS — R5383 Other fatigue: Secondary | ICD-10-CM | POA: Diagnosis not present

## 2017-07-06 DIAGNOSIS — R7989 Other specified abnormal findings of blood chemistry: Secondary | ICD-10-CM | POA: Diagnosis not present

## 2017-07-06 MED ORDER — BUSPIRONE HCL 7.5 MG PO TABS
7.5000 mg | ORAL_TABLET | Freq: Three times a day (TID) | ORAL | 0 refills | Status: DC
Start: 2017-07-06 — End: 2017-07-12

## 2017-07-06 MED ORDER — GABAPENTIN 100 MG PO CAPS: 100.0000 mg | ORAL_CAPSULE | Freq: Three times a day (TID) | ORAL | 0 refills | Status: DC

## 2017-07-06 NOTE — Patient Instructions (Addendum)
Thank you for coming in  We increased your Buspar to 7.5mg  three times a day   We will get some blood work today   We started Gabapentin. Take 1 tablet at bedtime first and slowly go up to 1 tab three times a day  I also made a referral to Physical Therapy   I would still like you to make an appointment with behavioral health.  Follow up in 1 month

## 2017-07-07 LAB — BASIC METABOLIC PANEL
BUN/Creatinine Ratio: 7 — ABNORMAL LOW (ref 9–20)
BUN: 8 mg/dL (ref 6–24)
CALCIUM: 9.3 mg/dL (ref 8.7–10.2)
CHLORIDE: 103 mmol/L (ref 96–106)
CO2: 25 mmol/L (ref 20–29)
Creatinine, Ser: 1.08 mg/dL (ref 0.76–1.27)
GFR calc Af Amer: 90 mL/min/{1.73_m2} (ref >59)
GFR, EST NON AFRICAN AMERICAN: 78 mL/min/{1.73_m2} (ref >59)
Glucose: 76 mg/dL (ref 65–99)
POTASSIUM: 4 mmol/L (ref 3.5–5.2)
Sodium: 144 mmol/L (ref 134–144)

## 2017-07-07 LAB — LIPID PANEL
CHOL/HDL RATIO: 6.6 ratio — AB (ref 0.0–5.0)
Cholesterol, Total: 192 mg/dL (ref 100–199)
HDL: 29 mg/dL — AB (ref >39)
LDL Calculated: 128 mg/dL — ABNORMAL HIGH (ref 0–99)
TRIGLYCERIDES: 175 mg/dL — AB (ref 0–149)
VLDL Cholesterol Cal: 35 mg/dL (ref 5–40)

## 2017-07-07 LAB — VITAMIN D 25 HYDROXY (VIT D DEFICIENCY, FRACTURES): Vit D, 25-Hydroxy: 20.4 ng/mL — ABNORMAL LOW (ref 30.0–100.0)

## 2017-07-09 ENCOUNTER — Telehealth: Payer: Self-pay | Admitting: Internal Medicine

## 2017-07-09 NOTE — Telephone Encounter (Signed)
Attempted to call both numbers noted in chart. Went to [employer], left message to return call. Will try again Monday if does not return call.   I wanted to discuss with him that his Vitamin D level is slightly low. I would recommend taking Vitamin D 800 IU daily. Additionally, his cholesterol levels are elevated. Has ASCVD risk 14.6%. This indicates initiating a moderate to high intensity statin as well as low dose ASA daily for primary prevention.

## 2017-07-09 NOTE — Telephone Encounter (Signed)
Pt called nurse line back.  He was informed of the below.   He would like the cholesterol med called into his pharmacy.   Pt also states that since he started the buspar he has had blurry vision.  Checked with Dr. Gwendlyn Deutscher, this is a common side effect.  Will have him stop med and appt made for Monday @ 2:50. [place], Salome Spotted, CMA

## 2017-07-11 NOTE — Progress Notes (Signed)
   [facility] Phone: [phone]   Date of Visit: 07/12/2017   HPI:  Anxiety/depression: -Patient reports having blurry vision since starting BuSpar.  He actually called the clinic on 11/16 to report at this point was told to stop this medication. -Since stopping the medication his blurry vision has resolved.   - He is still taking Zoloft 100 mg daily -He denies any thoughts of self-harm or harming others -Has not called Dr. Gwenlyn Saran regarding getting scheduled with [place]  Elevated cholesterol: -At his last visit we checked his cholesterol levels which were elevated.  His ASCVD risk score is 8.7%.  We discussed the risk and benefits of starting a statin.  Patient open to starting statin.   ROS: See HPI.  [place]:   PHYSICAL EXAM: BP (!) 144/84   Pulse (!) 108   Temp 98.1 F (36.7 C) (Oral)   Wt 114.8 kg (253 lb)   SpO2 98%   BMI 36.30 kg/m  GEN: NAD HEENT: Atraumatic, normocephalic, neck supple, EOMI, sclera clear  CV: RRR, no murmurs, rubs, or gallops PULM: CTAB, normal effort SKIN: No rash or cyanosis; warm and well-perfused EXTR: No lower extremity edema or calf tenderness PSYCH: Mood and affect euthymic, normal rate and volume of speech NEURO: Awake, alert, no focal deficits grossly, normal speech  ASSESSMENT/PLAN:  Health maintenance:  -Shingles vaccine sent to pharmacy  GAD (generalized anxiety disorder) Patient did not tolerate BuSpar due to side effect of blurred vision.  Blurred vision resolved after stopping medication.  I discussed with him the importance of getting established with either our mood clinic here at [facility] or another psychiatrist.  Patient is interested in [facility] mood clinic.  I provided him with contact information for Dr. Gwenlyn Saran.  Continue Zoloft for now will discontinue BuSpar.  Will refill Klonopin today.  HLD (hyperlipidemia) 10-year ASCVD risk score is 16% for patient.  Patient agreeable to start statin.  Starting  Lipitor 10 mg daily.  CMP was checked recently with normal LFTs.  Patient would also benefit from daily low-dose aspirin she did not discuss in clinic today.  Will call and let him know about this.   Smiley Houseman, MD PGY [place]

## 2017-07-12 ENCOUNTER — Ambulatory Visit: Payer: Medicaid Other | Admitting: Internal Medicine

## 2017-07-12 ENCOUNTER — Other Ambulatory Visit: Payer: Self-pay

## 2017-07-12 ENCOUNTER — Encounter: Payer: Self-pay | Admitting: Internal Medicine

## 2017-07-12 DIAGNOSIS — F411 Generalized anxiety disorder: Secondary | ICD-10-CM

## 2017-07-12 DIAGNOSIS — E785 Hyperlipidemia, unspecified: Secondary | ICD-10-CM

## 2017-07-12 MED ORDER — ATORVASTATIN CALCIUM 10 MG PO TABS: 10.0000 mg | ORAL_TABLET | Freq: Every day | ORAL | 0 refills | Status: DC

## 2017-07-12 MED ORDER — CLONAZEPAM 0.5 MG PO TABS: 0.5000 mg | ORAL_TABLET | Freq: Four times a day (QID) | ORAL | 0 refills | Status: DC | PRN

## 2017-07-12 MED ORDER — ZOSTER VAC RECOMB ADJUVANTED 50 MCG/0.5ML IM SUSR: 0.5000 mL | Freq: Once | INTRAMUSCULAR | 1 refills | Status: AC

## 2017-07-12 NOTE — Telephone Encounter (Signed)
noted 

## 2017-07-12 NOTE — Patient Instructions (Addendum)
Physical Therapy: [facility] at [facility] [phone]   Please get in contact with Dr. Gwenlyn Saran at [facility]  We started you on Lipitor 10mg  daily for cholesterol.  I sent the shingles vaccine to the pharmacy  Shingles Vaccine Patient Information: Obtained from CDC Website  Why get vaccinated? Shingles (also called herpes zoster, or just zoster) is a painful skin rash, often with blisters. Shingles is caused by the varicella zoster virus, the same virus that causes chickenpox. After you have chickenpox, the virus stays in your body and can cause shingles later in life.  You can't catch shingles from another person. However, a person who has never had chickenpox (or chickenpox vaccine) could get chickenpox from someone with shingles.  A shingles rash usually appears on one side of the face or body and heals within 2 to 4 weeks. Its main symptom is pain, which can be severe. Other symptoms can include fever, headache, chills, and upset stomach. Very rarely, a shingles infection can lead to pneumonia, hearing problems, blindness, brain inflammation (encephalitis), or death.  For about 1 person in 5, severe pain can continue even long after the rash has cleared up. This long-lasting pain is called post-herpetic neuralgia (PHN).  Shingles is far more common in people 2 years of age and older than in younger people, and the risk increases with age. It is also more common in people whose immune system is weakened because of a disease such as cancer, or by drugs such as steroids or chemotherapy.  At least 1 million people a year in the [place] get shingles.  Shingles Vaccine (recombinant) Recombinant shingles vaccine was approved by FDA in 2017 for the prevention of shingles. In clinical trials, it was more than 90% effective in preventing shingles. It can also reduce the likelihood of PHN.  Two doses, 2 to 6 months apart, are recommended  for adults 51 and older.  This vaccine is also recommended for people who have already gotten the live shingles vaccine (Zostavax). There is no live virus in this vaccine.  Some people should not get this vaccine Tell your vaccine provider if you:  Have any severe, life-threatening allergies. A person who has ever had a life-threatening allergic reaction after a dose of recombinant shingles vaccine, or has a severe allergy to any component of this vaccine, may be advised not to be vaccinated. Ask your health care provider if you want information about vaccine components. Are pregnant or breastfeeding. There is not much information about use of recombinant shingles vaccine in pregnant or nursing women. Your healthcare provider might recommend delaying vaccination. Are not feeling well. If you have a mild illness, such as a cold, you can probably get the vaccine today. If you are moderately or severely ill, you should probably wait until you recover. Your doctor can advise you.  Risks of a vaccine reaction With any medicine, including vaccines, there is a chance of reactions.  After recombinant shingles vaccination, a person might experience:  Pain, redness, soreness, or swelling at the site of the injection Headache, muscle aches, fever, shivering, fatigue In clinical trials, most people got a sore arm with mild or moderate pain after vaccination, and some also had redness and swelling where they got the shot. Some people felt tired, had muscle pain, a headache, shivering, fever, stomach pain, or nausea. About 1 out of 6 people who got recombinant zoster vaccine experienced side effects that prevented them from doing regular activities.  Symptoms went away on their own in about 2 to 3 days. Side effects were more common in younger people.  You should still get the second dose of recombinant zoster vaccine even if you had one of these reactions after the first dose.  Other things that could  happen after this vaccine:  People sometimes faint after medical procedures, including vaccination. Sitting or lying down for about 15 minutes can help prevent fainting and injuries caused by a fall. Tell your provider if you feel dizzy or have vision changes or ringing in the ears. Some people get shoulder pain that can be more severe and longer-lasting than routine soreness that can follow injections. This happens very rarely. Any medication can cause a severe allergic reaction. Such reactions to a vaccine are estimated at about 1 in a million doses, and would happen within a few minutes to a few hours after the vaccination. As with any medicine, there is a very remote chance of a vaccine causing a serious injury or death.  The safety of vaccines is always being monitored.  What if there is a serious problem? What should I look for?  Look for anything that concerns you, such as signs of a severe allergic reaction, very high fever, or unusual behavior.  Signs of a severe allergic reaction can include hives, swelling of the face and throat, difficulty breathing, a fast heartbeat, dizziness, and weakness. These would usually start a few minutes to a few hours after the vaccination.  What should I do?  If you think it is a severe allergic reaction or other emergency that can't wait, call 9-1-1 or get to the nearest hospital. Otherwise, call your health care provider.  Afterward, the reaction should be reported to the Vaccine Adverse Event Reporting System (VAERS). Your doctor should file this report, or you can do it yourself through the VAERS website, or by calling [phone].

## 2017-07-13 ENCOUNTER — Telehealth: Payer: Self-pay | Admitting: Internal Medicine

## 2017-07-13 ENCOUNTER — Encounter: Payer: Self-pay | Admitting: Internal Medicine

## 2017-07-13 DIAGNOSIS — E785 Hyperlipidemia, unspecified: Secondary | ICD-10-CM | POA: Insufficient documentation

## 2017-07-13 NOTE — Assessment & Plan Note (Signed)
10-year ASCVD risk score is 16% for patient.  Patient agreeable to start statin.  Starting Lipitor 10 mg daily.  CMP was checked recently with normal LFTs.  Patient would also benefit from daily low-dose aspirin she did not discuss in clinic today.  Will call and let him know about this.

## 2017-07-13 NOTE — Assessment & Plan Note (Signed)
Patient did not tolerate BuSpar due to side effect of blurred vision.  Blurred vision resolved after stopping medication.  I discussed with him the importance of getting established with either our mood clinic here at [facility] or another psychiatrist.  Patient is interested in [facility] mood clinic.  I provided him with contact information for Dr. Gwenlyn Saran.  Continue Zoloft for now will discontinue BuSpar.  Will refill Klonopin today.

## 2017-07-13 NOTE — Telephone Encounter (Signed)
Attempted to call patient but went to voicemail.   I wanted to let him know that he would benefit from taking a baby aspirin daily for heart protection, and would recommend starting Aspirin 81mg  daily.

## 2017-08-04 ENCOUNTER — Other Ambulatory Visit: Payer: Self-pay | Admitting: Internal Medicine

## 2017-08-11 ENCOUNTER — Other Ambulatory Visit: Payer: Self-pay | Admitting: Internal Medicine

## 2017-08-23 ENCOUNTER — Other Ambulatory Visit: Payer: Self-pay | Admitting: Internal Medicine

## 2017-09-15 ENCOUNTER — Ambulatory Visit: Payer: Medicaid Other | Admitting: Internal Medicine

## 2017-09-20 ENCOUNTER — Ambulatory Visit: Payer: Medicaid Other | Admitting: Internal Medicine

## 2017-12-15 ENCOUNTER — Other Ambulatory Visit: Payer: Self-pay | Admitting: Internal Medicine

## 2017-12-16 NOTE — Telephone Encounter (Signed)
LM for patient to call back and schedule an appointment.  Jazmin Hartsell,CMA  

## 2017-12-16 NOTE — Telephone Encounter (Signed)
Please have patient schedule a follow up visit

## 2017-12-28 ENCOUNTER — Other Ambulatory Visit: Payer: Self-pay | Admitting: Internal Medicine

## 2018-01-11 NOTE — Progress Notes (Deleted)
   [facility] Phone: [phone]   Date of Visit: 01/12/2018   HPI:  ***  ROS: See HPI.  [place]:  PMH: Asthma HTN IBS Barrett's Esophagus without Dysplasia MDD, GAD HLD Hx Colonic Polyps Prediabetes Tobacco Use DO   PHYSICAL EXAM: There were no vitals taken for this visit. Gen: *** HEENT: *** Heart: *** Lungs: *** Neuro: *** Ext: ***  ASSESSMENT/PLAN:  Health maintenance:  -***  No problem-specific Assessment & Plan notes found for this encounter.  FOLLOW UP: Follow up in *** for ***  Smiley Houseman, MD PGY [place]

## 2018-01-12 ENCOUNTER — Ambulatory Visit: Payer: Medicaid Other | Admitting: Internal Medicine

## 2018-02-01 ENCOUNTER — Telehealth: Payer: Self-pay | Admitting: Internal Medicine

## 2018-02-01 NOTE — Telephone Encounter (Signed)
Pt is still having gastrointestional problems. He is wanting to get a referral to [facility]  [phone]. Please advise

## 2018-02-01 NOTE — Telephone Encounter (Signed)
Spoke with patient and informed him that he needs an appointment before we can place referral.  Bryan Collins

## 2018-02-06 NOTE — Progress Notes (Addendum)
   [facility] Phone: [phone]   Date of Visit: 02/07/2018   HPI:  Abdominal Pain:  - this is a chronic problem which seemed to have worsened in the past 8 months or so - he reports of diffuse abdominal discomfort as if "someone punched me in the stomach". Pain is intermittent but occurs daily. Reports of fatigue as well  - no radiation of pain  - no aggravating or alleviating effects. Not related to food.  - mostly has normal bowel movement. Sometimes have nonbloody diarrhea. No constipation  - no urinary symptoms - has tried Pepto bismol and prilosec and Ibuprofen without much help.  - mother reports that he drink 10-12 cups of regular coffee per day  - seen by GI in the past for abdominal pain. He has history of barrett's esophagus and colonic polyp. Next colonoscopy due in 2020.  - has a history of IBS   Left Bicep Pain: - symptoms for the past 2 years  - it is isolated to his bicep. Sometimes he has tingling in his forearm.  - pain is intermittent  - cannot recall aggravating or alleviating factors - does not think pain is exacerbated by ambulation or exertion. No associated chest pain.  - mother reports that he sits and plays video games a lot and wonders if this is related.   Chronic Fatigue:  - reports that he is tired all the time - he is not sure if he snores at night - he does wake up at night sometimes  ROS: See HPI.  [place]:  PMH: HTN Asthma IBS Barrett's Esophagus Prediabetes GAD MDD/GAD HLD  PHYSICAL EXAM: BP 121/76   Pulse (!) 112   Temp 98.1 F (36.7 C) (Oral)   Wt 258 lb (117 kg)   SpO2 96%   BMI 37.02 kg/m  GEN: NAD CV: RRR, no murmurs, rubs, or gallops PULM: CTAB, normal effort ABD: Soft, diffuse tenderness to deep palpation without guarding or rebound, nondistended, NABS, no organomegaly MSK:  Left Shoulder/Arm:  Normal ROM of shoulder and elbow Normal strength of upper extremities  Normal sensation to light  touch of upper extremities  No tenderness to palpation of the bicep or forearm.  SKIN: No rash or cyanosis; warm and well-perfused EXTR: No lower extremity edema or calf tenderness PSYCH: Mood and affect euthymic, normal rate and volume of speech NEURO: Awake, alert, no focal deficits grossly, normal speech   ASSESSMENT/PLAN:  Chronic Fatigue: Will order TSH. Would benefit from daily exercise. Will also order sleep study. STOP BANG score of 6 (high risk of OSA)   IBS (irritable bowel syndrome) Symptoms he describes likely related to his IBS. No red flags on history or exam. Would like to see another GI physician. Referral made to GI doctor in [facility] per patient request. Trial of Bentyl. Will obtain CMP and CBC as well. Recommended stopping Ibuprofen for PRN pain. Also recommended slowly cutting down on caffeine intake. Follow up at the end of next week.   Left Arm Pain:  Currently asymptomatic and unable to reproduce symptoms on exam. Neurovascularly intact on exam. Unclear in etiology. It does not appear to be referred pain from neck. Not consistent with cardiac etiology. Likely MSK related. Unsure if it is related to his video game playing, but asked to cut back on this.   FOLLOW UP: Follow up end of next week.   Smiley Houseman, MD  PGY [place]

## 2018-02-07 ENCOUNTER — Ambulatory Visit: Payer: Medicaid Other | Admitting: Internal Medicine

## 2018-02-07 ENCOUNTER — Other Ambulatory Visit: Payer: Self-pay

## 2018-02-07 ENCOUNTER — Encounter: Payer: Self-pay | Admitting: Internal Medicine

## 2018-02-07 VITALS — BP 121/76 | HR 112 | Temp 98.1°F | Wt 258.0 lb

## 2018-02-07 DIAGNOSIS — R109 Unspecified abdominal pain: Secondary | ICD-10-CM | POA: Diagnosis present

## 2018-02-07 DIAGNOSIS — K589 Irritable bowel syndrome without diarrhea: Secondary | ICD-10-CM

## 2018-02-07 DIAGNOSIS — R5382 Chronic fatigue, unspecified: Secondary | ICD-10-CM

## 2018-02-07 DIAGNOSIS — G8929 Other chronic pain: Secondary | ICD-10-CM | POA: Diagnosis not present

## 2018-02-07 DIAGNOSIS — M79601 Pain in right arm: Secondary | ICD-10-CM | POA: Diagnosis not present

## 2018-02-07 MED ORDER — DICYCLOMINE HCL 20 MG PO TABS: 20.0000 mg | ORAL_TABLET | Freq: Three times a day (TID) | ORAL | 0 refills | Status: DC | PRN

## 2018-02-07 NOTE — Assessment & Plan Note (Addendum)
Symptoms he describes likely related to his IBS. No red flags on history or exam. Would like to see another GI physician. Referral made to GI doctor in [facility] per patient request. Trial of Bentyl. Will obtain CMP and CBC as well. Recommended stopping Ibuprofen for PRN pain. Also recommended slowly cutting down on caffeine intake. Follow up at the end of next week.

## 2018-02-07 NOTE — Patient Instructions (Addendum)
1) it is very important to cut down on your coffee intake slowly  2) I made a referral to your GI doctor 3) we will get some blood work today  We will order a sleep study  4) Follow up at the end of next week.

## 2018-02-08 ENCOUNTER — Other Ambulatory Visit: Payer: Self-pay | Admitting: Internal Medicine

## 2018-02-08 ENCOUNTER — Telehealth: Payer: Self-pay | Admitting: Internal Medicine

## 2018-02-08 DIAGNOSIS — D751 Secondary polycythemia: Secondary | ICD-10-CM

## 2018-02-08 LAB — CMP14+EGFR
ALK PHOS: 113 IU/L (ref 39–117)
ALT: 19 IU/L (ref 0–44)
AST: 13 IU/L (ref 0–40)
Albumin/Globulin Ratio: 1.4 (ref 1.2–2.2)
Albumin: 4.2 g/dL (ref 3.5–5.5)
BILIRUBIN TOTAL: 0.3 mg/dL (ref 0.0–1.2)
BUN / CREAT RATIO: 7 — AB (ref 9–20)
BUN: 8 mg/dL (ref 6–24)
CHLORIDE: 101 mmol/L (ref 96–106)
CO2: 24 mmol/L (ref 20–29)
Calcium: 9.8 mg/dL (ref 8.7–10.2)
Creatinine, Ser: 1.18 mg/dL (ref 0.76–1.27)
GFR calc Af Amer: 81 mL/min/{1.73_m2} (ref >59)
GFR calc non Af Amer: 70 mL/min/{1.73_m2} (ref >59)
GLOBULIN, TOTAL: 2.9 g/dL (ref 1.5–4.5)
Glucose: 95 mg/dL (ref 65–99)
Potassium: 4.3 mmol/L (ref 3.5–5.2)
SODIUM: 141 mmol/L (ref 134–144)
Total Protein: 7.1 g/dL (ref 6.0–8.5)

## 2018-02-08 LAB — CBC WITH DIFFERENTIAL/PLATELET
BASOS ABS: 0 10*3/uL (ref 0.0–0.2)
Basos: 0 %
EOS (ABSOLUTE): 0.2 10*3/uL (ref 0.0–0.4)
Eos: 1 %
HEMATOCRIT: 51.9 % — AB (ref 37.5–51.0)
HEMOGLOBIN: 18.2 g/dL — AB (ref 13.0–17.7)
Immature Grans (Abs): 0 10*3/uL (ref 0.0–0.1)
Immature Granulocytes: 0 %
LYMPHS ABS: 2.6 10*3/uL (ref 0.7–3.1)
Lymphs: 21 %
MCH: 31.6 pg (ref 26.6–33.0)
MCHC: 35.1 g/dL (ref 31.5–35.7)
MCV: 90 fL (ref 79–97)
MONOCYTES: 7 %
MONOS ABS: 0.9 10*3/uL (ref 0.1–0.9)
NEUTROS ABS: 8.6 10*3/uL — AB (ref 1.4–7.0)
Neutrophils: 71 %
Platelets: 153 10*3/uL (ref 150–450)
RBC: 5.76 x10E6/uL (ref 4.14–5.80)
RDW: 14.6 % (ref 12.3–15.4)
WBC: 12.3 10*3/uL — AB (ref 3.4–10.8)

## 2018-02-08 LAB — TSH: TSH: 3.11 u[IU]/mL (ref 0.450–4.500)

## 2018-02-08 NOTE — Addendum Note (Signed)
Addended by: Smiley Houseman on: 02/08/2018 02:46 PM   Modules accepted: Orders

## 2018-02-08 NOTE — Progress Notes (Signed)
Peripheral smear and EPO level ordered for lab visit this week.

## 2018-02-08 NOTE — Telephone Encounter (Signed)
Attempted to call patient to discuss lab results, but went to voicemail. His hemoglobin and hematocrit is elevated. I would like to do a peripheral smear and check another lab called EPO. This is evaluating for polycythemia. Will call again later if he does not reach out.

## 2018-02-09 NOTE — Addendum Note (Signed)
Addended by: Smiley Houseman on: 02/09/2018 08:06 AM   Modules accepted: Orders

## 2018-02-09 NOTE — Telephone Encounter (Signed)
Opened in error

## 2018-02-10 ENCOUNTER — Other Ambulatory Visit: Payer: Medicaid Other

## 2018-03-04 ENCOUNTER — Other Ambulatory Visit: Payer: Self-pay | Admitting: Gastroenterology

## 2018-03-06 ENCOUNTER — Other Ambulatory Visit: Payer: Self-pay | Admitting: Gastroenterology

## 2018-04-22 ENCOUNTER — Other Ambulatory Visit: Payer: Self-pay | Admitting: Internal Medicine

## 2018-05-09 ENCOUNTER — Other Ambulatory Visit: Payer: Self-pay | Admitting: Internal Medicine

## 2018-06-06 ENCOUNTER — Other Ambulatory Visit: Payer: Self-pay | Admitting: Internal Medicine

## 2018-06-08 NOTE — Telephone Encounter (Signed)
LM for patient to call back.  Please assist him in making an appointment to follow up on his anxiety when he does.  Thanks [employer]

## 2018-06-08 NOTE — Telephone Encounter (Signed)
Will refill for two weeks. Pt will need an apt for reassessment.

## 2018-08-12 ENCOUNTER — Ambulatory Visit: Payer: Medicaid Other | Admitting: Gastroenterology

## 2018-08-12 ENCOUNTER — Telehealth: Payer: Self-pay | Admitting: *Deleted

## 2018-08-12 ENCOUNTER — Encounter: Payer: Self-pay | Admitting: Gastroenterology

## 2018-08-12 VITALS — BP 126/82 | HR 115 | Ht 70.0 in | Wt 269.0 lb

## 2018-08-12 DIAGNOSIS — R1084 Generalized abdominal pain: Secondary | ICD-10-CM

## 2018-08-12 DIAGNOSIS — K219 Gastro-esophageal reflux disease without esophagitis: Secondary | ICD-10-CM | POA: Diagnosis not present

## 2018-08-12 DIAGNOSIS — K589 Irritable bowel syndrome without diarrhea: Secondary | ICD-10-CM

## 2018-08-12 DIAGNOSIS — K2270 Barrett's esophagus without dysplasia: Secondary | ICD-10-CM | POA: Diagnosis not present

## 2018-08-12 DIAGNOSIS — K5909 Other constipation: Secondary | ICD-10-CM

## 2018-08-12 DIAGNOSIS — F17200 Nicotine dependence, unspecified, uncomplicated: Secondary | ICD-10-CM

## 2018-08-12 MED ORDER — DICYCLOMINE HCL 20 MG PO TABS: 20.0000 mg | ORAL_TABLET | Freq: Three times a day (TID) | ORAL | 3 refills | Status: DC | PRN

## 2018-08-12 MED ORDER — FAMOTIDINE 20 MG PO TABS: 20.0000 mg | ORAL_TABLET | Freq: Every day | ORAL | 3 refills | Status: DC

## 2018-08-12 MED ORDER — DEXLANSOPRAZOLE 60 MG PO CPDR: 60.0000 mg | DELAYED_RELEASE_CAPSULE | Freq: Every day | ORAL | 3 refills | Status: DC

## 2018-08-12 MED ORDER — LUBIPROSTONE 8 MCG PO CAPS: 8.0000 ug | ORAL_CAPSULE | Freq: Two times a day (BID) | ORAL | 3 refills | Status: DC

## 2018-08-12 MED ORDER — LINACLOTIDE 72 MCG PO CAPS: 72.0000 ug | ORAL_CAPSULE | Freq: Every day | ORAL | 3 refills | Status: DC

## 2018-08-12 NOTE — Telephone Encounter (Signed)
Please send 2mcg daily. Thanks.

## 2018-08-12 NOTE — Progress Notes (Signed)
Bryan Collins    [id number]    [birth year]  Primary Care Physician:Patient, No Pcp Per  Referring Physician: Bonnita Hollow, MD [address], [place] [address]  Chief complaint:  GERD   HPI:  52 year old male with history of chronic GERD and Barrett's esophagus here for follow-up visit accompanied by his mother.  Colonoscopy May 2017: 15 mm polyp in cecum.  Colonic diverticulosis and internal hemorrhoids. EGD May 2017 showed long segment Barrett's esophagus with no dysplasia. EGD June 2018 Long segment Barrett's~10 cm.  Biopsy showed intestinal metaplasia with no dysplasia  He was last seen in office June 2018.  On care everywhere, patient was seen in consult by [facility] gastroenterology for evaluation of chronic abdominal pain in July 2019 by Dr. Willa Rough TTG IgA antibody negative for celiac.  Normal ESR and CRP He was recommended to follow-up in 3 months, he cancelled that and made an appointment here instead.  Patient and his mom said he had multiple ER visits due to abdominal pain to [facility] but on chart review he has not had any ER visits in the past 3 years.  He was seen in the ER for abdominal pain in 2016 and 2015  He has generalized abdominal pain.  Dicyclomine is helping to some extent but does not completely improve the pain.  He is taking omeprazole twice daily but continues to have persistent heartburn needing to take Pepto-Bismol or Mylanta on a regular basis.  Denies any dysphagia or odynophagia He continues to smoke and drinks coffee throughout the day multiple times.  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  He has daily bowel movement but feels he does not evacuate completely.  Denies any rectal bleeding.  He status post cholecystectomy in 2013, concerned about possible bile leak causing his abdominal pain   Outpatient Encounter Medications as of 08/12/2018  Medication  Sig  . amLODipine (NORVASC) 5 MG tablet TAKE 1 TABLET BY MOUTH EVERY DAY  . bismuth subsalicylate (PEPTO BISMOL) 262 MG/15ML suspension Take 30 mLs by mouth every 6 (six) hours as needed for indigestion.  . clonazePAM (KLONOPIN) 0.5 MG tablet Take 1 tablet (0.5 mg total) by mouth every 6 (six) hours as needed for up to 14 days. for anxiety  . dicyclomine (BENTYL) 20 MG tablet Take 1 tablet (20 mg total) by mouth 3 (three) times daily as needed for spasms (abdominal pain).  Marland Kitchen omeprazole (PRILOSEC) 40 MG capsule TAKE 1 CAPSULE (40 MG TOTAL) BY MOUTH 2 (TWO) TIMES DAILY BEFORE LUNCH AND SUPPER.  Marland Kitchen omeprazole (PRILOSEC) 40 MG capsule TAKE 1 CAPSULE (40 MG TOTAL) BY MOUTH 2 (TWO) TIMES DAILY BEFORE LUNCH AND SUPPER.  Marland Kitchen ZOLOFT 100 MG tablet TAKE 2 TABLETS BY MOUTH EVERY DAY  . [DISCONTINUED] ADVAIR DISKUS 250-50 MCG/DOSE AEPB INHALE 1 PUFF EVERY DAY (Patient not taking: Reported on 02/07/2018)  . [DISCONTINUED] albuterol (PROVENTIL HFA;VENTOLIN HFA) 108 (90 BASE) MCG/ACT inhaler Inhale 2 puffs into the lungs every 6 (six) hours as needed for wheezing or shortness of breath. (Patient not taking: Reported on 05/27/2017)  . [DISCONTINUED] atorvastatin (LIPITOR) 10 MG tablet TAKE 1 TABLET (10 MG TOTAL) DAILY BY MOUTH (Patient not taking: Reported on 02/07/2018)   No facility-administered encounter medications on file as of 08/12/2018.     Allergies as of 08/12/2018 - Review Complete 08/12/2018  Allergen Reaction Noted  .  Penicillins Hives 05/08/2014    Past Medical History:  Diagnosis Date  . Anxiety   . Asthma   . Barrett's esophagus   . Depression   . Diverticulitis   . Gastritis   . GERD (gastroesophageal reflux disease)   . Hiatal hernia   . HTN (hypertension)     Past Surgical History:  Procedure Laterality Date  . CHOLECYSTECTOMY      Family History  Problem Relation Age of Onset  . Breast cancer Mother   . Asthma Mother   . Gallstones Mother   . Heart attack Father   . Diabetes  Father   . Prostate cancer Father   . Gallbladder disease Maternal Grandmother   . Stroke Maternal Grandmother   . Colon cancer Neg Hx   . Esophageal cancer Neg Hx   . Pancreatic cancer Neg Hx   . Rectal cancer Neg Hx   . Stomach cancer Neg Hx     Social History   Socioeconomic History  . Marital status: Single    Spouse name: Not on file  . Number of children: 0  . Years of education: Not on file  . Highest education level: Not on file  Occupational History  . Occupation: disabled  Social Needs  . Financial resource strain: Not on file  . Food insecurity:    Worry: Not on file    Inability: Not on file  . Transportation needs:    Medical: Not on file    Non-medical: Not on file  Tobacco Use  . Smoking status: Current Every Day Smoker    Packs/day: 1.00    Years: 30.00    Pack years: 30.00    Types: Cigarettes    Start date: 01/22/1985  . Smokeless tobacco: Never Used  Substance and Sexual Activity  . Alcohol use: Never    Alcohol/week: 0.0 standard drinks    Frequency: Never    Comment: former   . Drug use: Not Currently    Comment: former   . Sexual activity: Not Currently  Lifestyle  . Physical activity:    Days per week: Not on file    Minutes per session: Not on file  . Stress: Not on file  Relationships  . Social connections:    Talks on phone: Not on file    Gets together: Not on file    Attends religious service: Not on file    Active member of club or organization: Not on file    Attends meetings of clubs or organizations: Not on file    Relationship status: Not on file  . Intimate partner violence:    Fear of current or ex partner: Not on file    Emotionally abused: Not on file    Physically abused: Not on file    Forced sexual activity: Not on file  Other Topics Concern  . Not on file  Social History Narrative   Lives with mother.      Review of systems: Review of Systems  Constitutional: Negative for fever and chills.  HENT:  Negative.   Eyes: Negative for blurred vision.  Respiratory: Positive for cough, shortness of breath and wheezing.   Cardiovascular: Negative for chest pain and palpitations.  Gastrointestinal: as per HPI Genitourinary: Negative for dysuria, urgency, frequency and hematuria.  Musculoskeletal: Negative for myalgias, back pain and joint pain.  Skin: Negative for itching and rash.  Neurological: Negative for dizziness, tremors, focal weakness, seizures and loss of consciousness.  Positive for frequent headaches  Endo/Heme/Allergies: Negative for seasonal allergies.  Psychiatric/Behavioral: Positive for for depression,  hallucinations and anxiety.  Negative for suicidal ideation All other systems reviewed and are negative.   Physical Exam: Vitals:   08/12/18 1059  BP: 126/82  Pulse: (!) 115   Body mass index is 38.6 kg/m. Gen:      No acute distress HEENT:  EOMI, sclera anicteric Neck:     No masses; no thyromegaly Lungs:    Clear to auscultation bilaterally; normal respiratory effort CV:         Regular rate and rhythm; no murmurs Abd:      + bowel sounds; soft, non-tender; no palpable masses, no distension Ext:    No edema; adequate peripheral perfusion Skin:      Warm and dry; no rash Neuro: alert and oriented x 3 Psych: normal mood and affect  Data Reviewed:  Reviewed labs, radiology imaging, old records and pertinent past GI work up   Assessment and Plan/Recommendations:  52 year old male with obesity, chronic smoker, GERD uncontrolled, long segment Barrett's esophagus, constipation and generalized abdominal pain  GERD and long segment Barrett's esophagus: He continues to smoke and drinks multiple caffeinated drinks throughout the day Discussed smoking cessation and decreasing caffeine intake.  Patient does not think he can quit smoking but he will try to decrease caffeine intake We will switch to Dexilant 60 mg daily Pepcid 20 mg at bedtime Gaviscon after meals as  needed Discussed antireflux measures   Constipation: Start Amitiza 8 mcg twice daily Increase dietary fiber and water intake  Generalized abdominal pain: Multifactorial/irritable bowel syndrome Dicyclomine 20 mg 3 times daily as needed Also do a trial of IBgard 1 capsule up to 3 times daily as needed He has had multiple imaging, endoscopic evaluation unrevealing etiology or pathology to account for the chronic abdominal pain  Colorectal cancer screening: Due for surveillance colonoscopy May 2020 Barrett's esophagus with no dysplasia due for surveillance EGD June 2021  K. Denzil Magnuson , MD [phone]    CC: Bonnita Hollow, MD

## 2018-08-12 NOTE — Telephone Encounter (Signed)
Linzess 72 mcg sent to pharmacy.  

## 2018-08-12 NOTE — Telephone Encounter (Signed)
Dexilant approved until 08/07/2019  Aut # [id number]

## 2018-08-12 NOTE — Patient Instructions (Addendum)
Start Dexilant 60 mg daily  Take Pepcid 20 mg at bedtime  Use Gaviscon after meals three times a day as needed  Take IBGard 1 capsule three times a day as needed  We will refill dicyclomine   Take Amitiza 8 mcg twice a day   If you are age 54 or older, your body mass index should be between 23-30. Your Body mass index is 38.6 kg/m. If this is out of the aforementioned range listed, please consider follow up with your Primary Care Provider.  If you are age 24 or younger, your body mass index should be between 19-25. Your Body mass index is 38.6 kg/m. If this is out of the aformentioned range listed, please consider follow up with your Primary Care Provider.    Thank you for choosing [facility]  Karleen Hampshire Nand[place]am,MD

## 2018-08-12 NOTE — Telephone Encounter (Signed)
Patients insurance wants him to try Linzess, I dont see that he's been on that, What mg do you want me to send in for him?

## 2018-09-25 ENCOUNTER — Other Ambulatory Visit: Payer: Self-pay | Admitting: Family Medicine

## 2018-10-26 ENCOUNTER — Other Ambulatory Visit: Payer: Self-pay | Admitting: Family Medicine

## 2018-10-27 NOTE — Telephone Encounter (Signed)
Patient will need an appointment to discuss ongoing anxiety medications. Refill not appropriate at this time.

## 2018-10-27 NOTE — Telephone Encounter (Signed)
LM for patient to call back and make an appointment to follow up on his medication. Jazmin Hartsell,CMA

## 2018-12-03 ENCOUNTER — Other Ambulatory Visit: Payer: Self-pay | Admitting: Gastroenterology

## 2019-01-03 ENCOUNTER — Encounter: Payer: Self-pay | Admitting: Gastroenterology

## 2019-01-11 ENCOUNTER — Ambulatory Visit: Payer: Medicaid Other | Admitting: Family Medicine

## 2019-01-17 ENCOUNTER — Other Ambulatory Visit: Payer: Self-pay | Admitting: *Deleted

## 2019-01-18 MED ORDER — ZOLOFT 100 MG PO TABS
200.0000 mg | ORAL_TABLET | Freq: Every day | ORAL | 3 refills | Status: DC
Start: 2019-01-18 — End: 2019-05-16

## 2019-02-28 ENCOUNTER — Emergency Department (HOSPITAL_BASED_OUTPATIENT_CLINIC_OR_DEPARTMENT_OTHER)
Admission: EM | Admit: 2019-02-28 | Discharge: 2019-02-28 | Disposition: A | Discharge status: Home / Self Care | Payer: Medicaid Other | Attending: Emergency Medicine | Admitting: Emergency Medicine

## 2019-02-28 ENCOUNTER — Encounter (HOSPITAL_BASED_OUTPATIENT_CLINIC_OR_DEPARTMENT_OTHER): Payer: Self-pay

## 2019-02-28 ENCOUNTER — Emergency Department (HOSPITAL_BASED_OUTPATIENT_CLINIC_OR_DEPARTMENT_OTHER): Payer: Medicaid Other

## 2019-02-28 ENCOUNTER — Other Ambulatory Visit: Payer: Self-pay

## 2019-02-28 DIAGNOSIS — F17210 Nicotine dependence, cigarettes, uncomplicated: Secondary | ICD-10-CM | POA: Insufficient documentation

## 2019-02-28 DIAGNOSIS — G43809 Other migraine, not intractable, without status migrainosus: Secondary | ICD-10-CM | POA: Diagnosis not present

## 2019-02-28 DIAGNOSIS — F419 Anxiety disorder, unspecified: Secondary | ICD-10-CM | POA: Diagnosis not present

## 2019-02-28 DIAGNOSIS — Z20828 Contact with and (suspected) exposure to other viral communicable diseases: Secondary | ICD-10-CM | POA: Insufficient documentation

## 2019-02-28 DIAGNOSIS — I10 Essential (primary) hypertension: Secondary | ICD-10-CM | POA: Insufficient documentation

## 2019-02-28 DIAGNOSIS — Z79899 Other long term (current) drug therapy: Secondary | ICD-10-CM | POA: Diagnosis not present

## 2019-02-28 DIAGNOSIS — J45909 Unspecified asthma, uncomplicated: Secondary | ICD-10-CM | POA: Insufficient documentation

## 2019-02-28 DIAGNOSIS — R51 Headache: Secondary | ICD-10-CM | POA: Diagnosis present

## 2019-02-28 DIAGNOSIS — F1320 Sedative, hypnotic or anxiolytic dependence, uncomplicated: Secondary | ICD-10-CM

## 2019-02-28 LAB — URINALYSIS, ROUTINE W REFLEX MICROSCOPIC
Glucose, UA: NEGATIVE mg/dL
Ketones, ur: NEGATIVE mg/dL
Leukocytes,Ua: NEGATIVE
Nitrite: NEGATIVE
Protein, ur: 100 mg/dL — AB
Specific Gravity, Urine: >1.03 — ABNORMAL HIGH (ref 1.005–1.030)
pH: 6 (ref 5.0–8.0)

## 2019-02-28 LAB — CBC WITH DIFFERENTIAL/PLATELET
Abs Immature Granulocytes: 0.09 10*3/uL — ABNORMAL HIGH (ref 0.00–0.07)
Basophils Absolute: 0.1 10*3/uL (ref 0.0–0.1)
Basophils Relative: 0 %
Eosinophils Absolute: 0.2 10*3/uL (ref 0.0–0.5)
Eosinophils Relative: 1 %
HCT: 49.9 % (ref 39.0–52.0)
Hemoglobin: 17.1 g/dL — ABNORMAL HIGH (ref 13.0–17.0)
Immature Granulocytes: 1 %
Lymphocytes Relative: 16 %
Lymphs Abs: 3 10*3/uL (ref 0.7–4.0)
MCH: 30.9 pg (ref 26.0–34.0)
MCHC: 34.3 g/dL (ref 30.0–36.0)
MCV: 90.2 fL (ref 80.0–100.0)
Monocytes Absolute: 0.9 10*3/uL (ref 0.1–1.0)
Monocytes Relative: 5 %
Neutro Abs: 14.7 10*3/uL — ABNORMAL HIGH (ref 1.7–7.7)
Neutrophils Relative %: 77 %
Platelets: 193 10*3/uL (ref 150–400)
RBC: 5.53 MIL/uL (ref 4.22–5.81)
RDW: 13.2 % (ref 11.5–15.5)
WBC: 18.9 10*3/uL — ABNORMAL HIGH (ref 4.0–10.5)
nRBC: 0 % (ref 0.0–0.2)

## 2019-02-28 LAB — URINALYSIS, MICROSCOPIC (REFLEX): WBC, UA: NONE SEEN WBC/hpf (ref 0–5)

## 2019-02-28 LAB — BASIC METABOLIC PANEL
Anion gap: 11 (ref 5–15)
BUN: 11 mg/dL (ref 6–20)
CO2: 24 mmol/L (ref 22–32)
Calcium: 9 mg/dL (ref 8.9–10.3)
Chloride: 104 mmol/L (ref 98–111)
Creatinine, Ser: 1.08 mg/dL (ref 0.61–1.24)
GFR calc Af Amer: >60 mL/min (ref >60)
GFR calc non Af Amer: >60 mL/min (ref >60)
Glucose, Bld: 129 mg/dL — ABNORMAL HIGH (ref 70–99)
Potassium: 3.1 mmol/L — ABNORMAL LOW (ref 3.5–5.1)
Sodium: 139 mmol/L (ref 135–145)

## 2019-02-28 MED ORDER — ACETAMINOPHEN 325 MG PO TABS
650.0000 mg | ORAL_TABLET | Freq: Once | ORAL | Status: AC
  Administered 2019-02-28: 650 mg via ORAL
  Filled 2019-02-28: qty 2

## 2019-02-28 MED ORDER — LORAZEPAM 2 MG/ML IJ SOLN
1.0000 mg | Freq: Once | INTRAMUSCULAR | Status: AC
  Administered 2019-02-28: 1 mg via INTRAVENOUS
  Filled 2019-02-28: qty 1

## 2019-02-28 MED ORDER — POTASSIUM CHLORIDE CRYS ER 20 MEQ PO TBCR
40.0000 meq | EXTENDED_RELEASE_TABLET | Freq: Once | ORAL | Status: AC
  Administered 2019-02-28: 40 meq via ORAL
  Filled 2019-02-28: qty 2

## 2019-02-28 MED ORDER — HYDROXYZINE HCL 25 MG PO TABS: 25.0000 mg | ORAL_TABLET | Freq: Four times a day (QID) | ORAL | 0 refills | Status: AC

## 2019-02-28 MED ORDER — LORAZEPAM 1 MG PO TABS: 0.5000 mg | ORAL_TABLET | Freq: Once | ORAL | Status: DC

## 2019-02-28 MED ORDER — PROCHLORPERAZINE EDISYLATE 10 MG/2ML IJ SOLN
10.0000 mg | Freq: Once | INTRAMUSCULAR | Status: AC
  Administered 2019-02-28: 18:00:00 10 mg via INTRAVENOUS
  Filled 2019-02-28: qty 2

## 2019-02-28 MED ORDER — SODIUM CHLORIDE 0.9 % IV BOLUS
1000.0000 mL | Freq: Once | INTRAVENOUS | Status: AC
  Administered 2019-02-28: 1000 mL via INTRAVENOUS

## 2019-02-28 MED ORDER — LORAZEPAM 1 MG PO TABS
1.0000 mg | ORAL_TABLET | Freq: Once | ORAL | Status: AC
  Administered 2019-02-28: 1 mg via ORAL
  Filled 2019-02-28: qty 1

## 2019-02-28 MED ORDER — SODIUM CHLORIDE 0.9 % IV BOLUS: 1000.0000 mL | Freq: Once | INTRAVENOUS | Status: DC

## 2019-02-28 NOTE — ED Notes (Signed)
Patient transported to CT 

## 2019-02-28 NOTE — ED Notes (Signed)
Pt aware of need for urine specimen.  Provided container for specimen

## 2019-02-28 NOTE — ED Triage Notes (Addendum)
Pt c/o HA since Jan 2020-has not sought medical tx until today-was seen at [facility] today-labs drawn and advised to come to ED-when asked if any flu like sx pt states "yes since 2017"-NAD-steady gait

## 2019-02-28 NOTE — ED Provider Notes (Addendum)
[facility] EMERGENCY DEPARTMENT Provider Note   CSN: [id number] Arrival date & time: 02/28/19  1701    History   Chief Complaint Chief Complaint  Patient presents with   Headache   Anxiety    HPI Bryan Collins is a 53 y.o. male.     Patient states headaches for the last 7 months.  Continues to have daily headaches.  States that he used to get benzodiazepine prescriptions but no longer follows with family medicine.  Patient takes benzodiazepines from his mother to prevent full withdrawals.  Has not had a dose in several days.  Patient with nausea, diarrhea at times.  Feels anxious, sweaty.  Denies any chest pain, shortness of breath.  Has had a cough.  The history is provided by the patient.  Migraine This is a chronic problem. The current episode started more than 1 week ago (last 7 months). The problem occurs every several days. The problem has not changed since onset.Associated symptoms include headaches. Pertinent negatives include no chest pain, no abdominal pain and no shortness of breath. Nothing aggravates the symptoms. Nothing relieves the symptoms. He has tried nothing for the symptoms. The treatment provided no relief.    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 Authorizing Provider  amLODipine (NORVASC) 5 MG tablet TAKE 1 TABLET BY MOUTH EVERY DAY 04/22/18   Bonnita Hollow, MD  bismuth subsalicylate (PEPTO BISMOL) 262 MG/15ML suspension Take 30 mLs by mouth every 6 (six) hours as needed for indigestion.    [provider]  clonazePAM (KLONOPIN) 0.5 MG tablet Take 1 tablet (0.5 mg total) by mouth every 6 (six) hours as needed for up to 14 days. for anxiety 06/08/18 06/22/18  Bonnita Hollow, MD  DEXILANT 60 MG capsule TAKE 1 CAPSULE BY MOUTH EVERY DAY 12/05/18   Mauri Pole, MD  dicyclomine (BENTYL) 20 MG tablet Take 1 tablet (20 mg total) by mouth 3 (three) times daily as needed for spasms (abdominal pain). 08/12/18   Mauri Pole, MD  famotidine (PEPCID) 20 MG tablet TAKE 1 TABLET BY MOUTH EVERYDAY AT BEDTIME 12/05/18   Mauri Pole, MD  hydrOXYzine (ATARAX/VISTARIL) 25 MG tablet Take 1 tablet (25 mg total) by mouth every 6 (six) hours for 20 doses. 02/28/19 03/05/19  Lennice Sites, DO  linaclotide (LINZESS) 72 MCG capsule Take 1 capsule (72 mcg total) by mouth daily before breakfast. 08/12/18  Mauri Pole, MD  ZOLOFT 100 MG tablet Take 2 tablets (200 mg total) by mouth daily. 01/18/19   Bonnita Hollow, MD    Family History Family History  Problem Relation Age of Onset   Breast cancer Mother    Asthma Mother    Gallstones Mother    Heart attack Father    Diabetes Father    Prostate cancer Father    Gallbladder disease Maternal Grandmother    Stroke Maternal Grandmother    Colon cancer Neg Hx    Esophageal cancer Neg Hx    Pancreatic cancer Neg Hx    Rectal cancer Neg Hx    Stomach cancer Neg  Hx     Social History Social History   Tobacco Use   Smoking status: Current Every Day Smoker    Packs/day: 1.00    Years: 30.00    Pack years: 30.00    Types: Cigarettes    Start date: 01/22/1985   Smokeless tobacco: Never Used  Substance Use Topics   Alcohol use: Never    Frequency: Never   Drug use: Not Currently     Allergies   Penicillins   Review of Systems Review of Systems  Constitutional: Negative for chills and fever.  HENT: Negative for ear pain and sore throat.   Eyes: Negative for pain and visual disturbance.  Respiratory: Positive for cough. Negative for shortness of breath.   Cardiovascular: Negative for chest pain and palpitations.  Gastrointestinal: Positive for diarrhea and nausea. Negative for abdominal pain and vomiting.  Genitourinary: Negative for dysuria and hematuria.  Musculoskeletal: Negative for arthralgias and back pain.  Skin: Negative for color change and rash.  Neurological: Positive for headaches. Negative for dizziness, tremors, seizures, syncope, facial asymmetry, speech difficulty, weakness, light-headedness and numbness.  Psychiatric/Behavioral: The patient is nervous/anxious.   All other systems reviewed and are negative.    Physical Exam Updated Vital Signs  ED Triage Vitals  Enc Vitals Group     BP 02/28/19 1718 (!) 202/122     Pulse Rate 02/28/19 1718 (!) 123     Resp 02/28/19 1718 20     Temp 02/28/19 1718 99.9 F (37.7 C)     Temp Source 02/28/19 1718 Oral     SpO2 02/28/19 1718 97 %     Weight 02/28/19 1718 263 lb (119.3 kg)     Height 02/28/19 1718 5\' 10"  (1.778 m)     Head Circumference --      Peak Flow --      Pain Score 02/28/19 1716 8     Pain Loc --      Pain Edu? --      Excl. in [place]? --     Physical Exam Vitals signs and nursing note reviewed.  Constitutional:      Appearance: He is well-developed.  HENT:     Head: Normocephalic and atraumatic.     Mouth/Throat:     Mouth: Mucous membranes are  moist.     Pharynx: Oropharynx is clear.  Eyes:     General: No visual field deficit.    Extraocular Movements: Extraocular movements intact.     Conjunctiva/sclera: Conjunctivae normal.     Pupils: Pupils are equal, round, and reactive to light.  Neck:     Musculoskeletal: Normal range of motion and neck supple. No neck rigidity.     Meningeal: Brudzinski's sign and Kernig's sign absent.  Cardiovascular:     Rate and Rhythm: Regular rhythm. Tachycardia  present.     Heart sounds: Normal heart sounds. No murmur.  Pulmonary:     Effort: Pulmonary effort is normal. No respiratory distress.     Breath sounds: Normal breath sounds.  Abdominal:     General: Bowel sounds are normal. There is no distension.     Palpations: Abdomen is soft. There is no mass.     Tenderness: There is no abdominal tenderness. There is no guarding.  Musculoskeletal: Normal range of motion.  Lymphadenopathy:     Cervical: No cervical adenopathy.  Skin:    General: Skin is warm and dry.  Neurological:     Mental Status: He is alert and oriented to person, place, and time.     Cranial Nerves: No cranial nerve deficit or dysarthria.     Sensory: No sensory deficit.     Motor: No weakness.     Coordination: Coordination normal.  Psychiatric:        Mood and Affect: Mood is anxious.      ED Treatments / Results  Labs (all labs ordered are listed, but only abnormal results are displayed) Labs Reviewed  CBC WITH DIFFERENTIAL/PLATELET - Abnormal; Notable for the following components:      Result Value   WBC 18.9 (*)    Hemoglobin 17.1 (*)    Neutro Abs 14.7 (*)    Abs Immature Granulocytes 0.09 (*)    All other components within normal limits  BASIC METABOLIC PANEL - Abnormal; Notable for the following components:   Potassium 3.1 (*)    Glucose, Bld 129 (*)    All other components within normal limits  URINALYSIS, ROUTINE W REFLEX MICROSCOPIC - Abnormal; Notable for the following components:    Specific Gravity, Urine >1.030 (*)    Hgb urine dipstick MODERATE (*)    Bilirubin Urine SMALL (*)    Protein, ur 100 (*)    All other components within normal limits  URINALYSIS, MICROSCOPIC (REFLEX) - Abnormal; Notable for the following components:   Bacteria, UA RARE (*)    All other components within normal limits  NOVEL CORONAVIRUS, NAA (HOSPITAL ORDER, SEND-OUT TO REF LAB)    EKG EKG Interpretation  Date/Time:  Tuesday February 28 2019 17:36:44 EDT Ventricular Rate:  114 PR Interval:    QRS Duration: 96 QT Interval:  334 QTC Calculation: 460 R Axis:   98 Text Interpretation:  Sinus tachycardia Borderline right axis deviation Baseline wander in lead(s) V2 V3 V4 Confirmed by Lennice Sites [phone]) on 02/28/2019 5:39:13 PM   Radiology Ct Head Wo Contrast  Result Date: 02/28/2019 CLINICAL DATA:  Intermittent headache. EXAM: CT HEAD WITHOUT CONTRAST TECHNIQUE: Contiguous axial images were obtained from the base of the skull through the vertex without intravenous contrast. COMPARISON:  None. FINDINGS: Brain: The ventricles are normal in size and configuration. No extra-axial fluid collections are identified. The gray-white differentiation is maintained. No CT findings for acute hemispheric infarction or intracranial hemorrhage. No mass lesions. The brainstem and cerebellum are normal. Vascular: No hyperdense vessels or obvious aneurysm. Skull: No acute skull fracture.  No bone lesion. Sinuses/Orbits: The paranasal sinuses and mastoid air cells are clear except for mucous retention cysts or polyps in the maxillary sinuses. The globes are intact. Other: No scalp lesions, laceration or hematoma. IMPRESSION: No acute intracranial findings or mass lesion. Electronically Signed   By: Marijo Sanes M.D.   On: 02/28/2019 18:33   Dg Chest Portable 1 View  Result Date: 02/28/2019 CLINICAL DATA:  Intermittent headache since  January 2020. EXAM: PORTABLE CHEST 1 VIEW COMPARISON:  05/15/2017 FINDINGS: The  heart is upper limits of normal in size. The mediastinal and hilar contours are within normal limits and unchanged. The lungs demonstrate emphysematous changes but no acute overlying pulmonary process. No infiltrates or effusions. The bony thorax is intact. IMPRESSION: 1. Stable underlying emphysematous changes. 2. No acute pulmonary findings. Electronically Signed   By: Marijo Sanes M.D.   On: 02/28/2019 18:30    Procedures Procedures (including critical care time)  Medications Ordered in ED Medications  LORazepam (ATIVAN) tablet 1 mg (has no administration in time range)  LORazepam (ATIVAN) injection 1 mg (1 mg Intravenous Given 02/28/19 1749)  acetaminophen (TYLENOL) tablet 650 mg (650 mg Oral Given 02/28/19 1749)  sodium chloride 0.9 % bolus 1,000 mL (0 mLs Intravenous Stopped 02/28/19 1850)  prochlorperazine (COMPAZINE) injection 10 mg (10 mg Intravenous Given 02/28/19 1825)  potassium chloride SA (K-DUR) CR tablet 40 mEq (40 mEq Oral Given 02/28/19 1825)     Initial Impression / Assessment and Plan / ED Course  I have reviewed the triage vital signs and the nursing notes.  Pertinent labs & imaging results that were available during my care of the patient were reviewed by me and considered in my medical decision making (see chart for details).     Bryan Collins is a 53 year old male history of chronic Lyme, anxiety, hypertension who presents to the ED with headache, anxiety, concern for withdrawal from Klonopin.  Patient with tachycardia, hypertension but otherwise normal vitals.  Has had headaches more frequently over the last 7 months.  Normal neurological exam.  Has been taking Klonopin from his mother and he no longer gets prescription.  Has not seen psychiatry or primary care in a long time.  Patient states chronic illness with Lyme disease.  Had gastritis problems in the past.  Patient overall is anxious.  Denies any suicidal homicidal ideation.  Has no symptoms consistent with meningitis.   Overall patient states that he is under a lot of stress, not eating or drinking well or sleeping well.  Will treat with IV fluids, Ativan, Tylenol.  Will get a head CT given length of headaches.  Will get basic labs.  Will reevaluate. Possible withdrawal from benzos as he states he gets them from "family".   Patient with a leukocytosis of 18.9.  However, urinalysis normal.  Head CT normal.  Chest x-ray normal.  Patient had improvement of symptoms with Ativan, Compazine, IV fluids.  Overall suspect that he might have withdrawal from benzodiazepines.  Patient does not have a primary care doctor or follow with psychiatry.  I do believe he has underlying anxiety and needs close monitoring of medication usage.  Patient has no signs to suggest meningitis.  No abdominal pain, no rash.  Has had some diarrhea.  Possibly from withdrawal or viral GI process.  However, will swab for coronavirus.  Given education about self isolation.  Will give prescription for Vistaril.  Patient has not had any seizures and no need for admission for withdrawal symptoms at this time.  Will give information to set up primary care and given information to follow with psychiatry.  Patient understands return precautions.  Heart rate and vitals normal at time of discharge.  This chart was dictated using voice recognition software.  Despite best efforts to proofread,  errors can occur which can change the documentation meaning.    Final Clinical Impressions(s) / ED Diagnoses   Final diagnoses:  Other migraine  without status migrainosus, not intractable  Anxiety  Benzodiazepine dependence [facility])    ED Discharge Orders         Ordered    hydrOXYzine (ATARAX/VISTARIL) 25 MG tablet  Every 6 hours     02/28/19 1853           Lennice Sites, DO 02/28/19 [place], [place], DO 02/28/19 1857

## 2019-03-02 LAB — NOVEL CORONAVIRUS, NAA (HOSP ORDER, SEND-OUT TO REF LAB; TAT 18-24 HRS): SARS-CoV-2, NAA: NOT DETECTED

## 2019-03-09 ENCOUNTER — Emergency Department (HOSPITAL_BASED_OUTPATIENT_CLINIC_OR_DEPARTMENT_OTHER)
Admission: EM | Admit: 2019-03-09 | Discharge: 2019-03-09 | Disposition: A | Discharge status: Home / Self Care | Payer: Medicaid Other | Attending: Emergency Medicine | Admitting: Emergency Medicine

## 2019-03-09 ENCOUNTER — Emergency Department (HOSPITAL_BASED_OUTPATIENT_CLINIC_OR_DEPARTMENT_OTHER): Payer: Medicaid Other

## 2019-03-09 ENCOUNTER — Other Ambulatory Visit: Payer: Self-pay

## 2019-03-09 ENCOUNTER — Encounter (HOSPITAL_BASED_OUTPATIENT_CLINIC_OR_DEPARTMENT_OTHER): Payer: Self-pay

## 2019-03-09 DIAGNOSIS — R5383 Other fatigue: Secondary | ICD-10-CM

## 2019-03-09 DIAGNOSIS — F17210 Nicotine dependence, cigarettes, uncomplicated: Secondary | ICD-10-CM | POA: Diagnosis not present

## 2019-03-09 DIAGNOSIS — G43809 Other migraine, not intractable, without status migrainosus: Secondary | ICD-10-CM | POA: Diagnosis not present

## 2019-03-09 DIAGNOSIS — R109 Unspecified abdominal pain: Secondary | ICD-10-CM

## 2019-03-09 DIAGNOSIS — Z79899 Other long term (current) drug therapy: Secondary | ICD-10-CM | POA: Diagnosis not present

## 2019-03-09 DIAGNOSIS — I10 Essential (primary) hypertension: Secondary | ICD-10-CM | POA: Insufficient documentation

## 2019-03-09 DIAGNOSIS — J45909 Unspecified asthma, uncomplicated: Secondary | ICD-10-CM | POA: Diagnosis not present

## 2019-03-09 DIAGNOSIS — R51 Headache: Secondary | ICD-10-CM | POA: Diagnosis present

## 2019-03-09 DIAGNOSIS — R519 Headache, unspecified: Secondary | ICD-10-CM

## 2019-03-09 LAB — COMPREHENSIVE METABOLIC PANEL
ALT: 23 U/L (ref 0–44)
AST: 18 U/L (ref 15–41)
Albumin: 3.8 g/dL (ref 3.5–5.0)
Alkaline Phosphatase: 88 U/L (ref 38–126)
Anion gap: 12 (ref 5–15)
BUN: 7 mg/dL (ref 6–20)
CO2: 23 mmol/L (ref 22–32)
Calcium: 9.2 mg/dL (ref 8.9–10.3)
Chloride: 104 mmol/L (ref 98–111)
Creatinine, Ser: 1.06 mg/dL (ref 0.61–1.24)
GFR calc Af Amer: >60 mL/min (ref >60)
GFR calc non Af Amer: >60 mL/min (ref >60)
Glucose, Bld: 142 mg/dL — ABNORMAL HIGH (ref 70–99)
Potassium: 3.2 mmol/L — ABNORMAL LOW (ref 3.5–5.1)
Sodium: 139 mmol/L (ref 135–145)
Total Bilirubin: 0.5 mg/dL (ref 0.3–1.2)
Total Protein: 7.1 g/dL (ref 6.5–8.1)

## 2019-03-09 LAB — URINALYSIS, ROUTINE W REFLEX MICROSCOPIC
Bilirubin Urine: NEGATIVE
Glucose, UA: NEGATIVE mg/dL
Hgb urine dipstick: NEGATIVE
Ketones, ur: NEGATIVE mg/dL
Leukocytes,Ua: NEGATIVE
Nitrite: NEGATIVE
Protein, ur: 30 mg/dL — AB
Specific Gravity, Urine: 1.025 (ref 1.005–1.030)
pH: 6 (ref 5.0–8.0)

## 2019-03-09 LAB — URINALYSIS, MICROSCOPIC (REFLEX)

## 2019-03-09 LAB — CBC WITH DIFFERENTIAL/PLATELET
Abs Immature Granulocytes: 0.05 10*3/uL (ref 0.00–0.07)
Basophils Absolute: 0.1 10*3/uL (ref 0.0–0.1)
Basophils Relative: 1 %
Eosinophils Absolute: 0.1 10*3/uL (ref 0.0–0.5)
Eosinophils Relative: 1 %
HCT: 52.7 % — ABNORMAL HIGH (ref 39.0–52.0)
Hemoglobin: 18 g/dL — ABNORMAL HIGH (ref 13.0–17.0)
Immature Granulocytes: 0 %
Lymphocytes Relative: 19 %
Lymphs Abs: 2.3 10*3/uL (ref 0.7–4.0)
MCH: 30.9 pg (ref 26.0–34.0)
MCHC: 34.2 g/dL (ref 30.0–36.0)
MCV: 90.4 fL (ref 80.0–100.0)
Monocytes Absolute: 0.8 10*3/uL (ref 0.1–1.0)
Monocytes Relative: 6 %
Neutro Abs: 9.2 10*3/uL — ABNORMAL HIGH (ref 1.7–7.7)
Neutrophils Relative %: 73 %
Platelets: 210 10*3/uL (ref 150–400)
RBC: 5.83 MIL/uL — ABNORMAL HIGH (ref 4.22–5.81)
RDW: 13.3 % (ref 11.5–15.5)
WBC: 12.5 10*3/uL — ABNORMAL HIGH (ref 4.0–10.5)
nRBC: 0 % (ref 0.0–0.2)

## 2019-03-09 LAB — TROPONIN I (HIGH SENSITIVITY): Troponin I (High Sensitivity): 5 ng/L (ref <18)

## 2019-03-09 LAB — LIPASE, BLOOD: Lipase: 56 U/L — ABNORMAL HIGH (ref 11–51)

## 2019-03-09 MED ORDER — SODIUM CHLORIDE 0.9 % IV BOLUS
1000.0000 mL | Freq: Once | INTRAVENOUS | Status: AC
  Administered 2019-03-09: 17:00:00 1000 mL via INTRAVENOUS

## 2019-03-09 MED ORDER — PROCHLORPERAZINE EDISYLATE 10 MG/2ML IJ SOLN
10.0000 mg | Freq: Once | INTRAMUSCULAR | Status: AC
  Administered 2019-03-09: 17:00:00 10 mg via INTRAVENOUS
  Filled 2019-03-09: qty 2

## 2019-03-09 MED ORDER — DIAZEPAM 5 MG/ML IJ SOLN
5.0000 mg | Freq: Once | INTRAMUSCULAR | Status: AC
  Administered 2019-03-09: 5 mg via INTRAVENOUS
  Filled 2019-03-09: qty 2

## 2019-03-09 MED ORDER — DIPHENHYDRAMINE HCL 50 MG/ML IJ SOLN
25.0000 mg | Freq: Once | INTRAMUSCULAR | Status: AC
  Administered 2019-03-09: 17:00:00 25 mg via INTRAVENOUS
  Filled 2019-03-09: qty 1

## 2019-03-09 MED ORDER — DEXAMETHASONE SODIUM PHOSPHATE 10 MG/ML IJ SOLN
INTRAMUSCULAR | Status: AC
  Filled 2019-03-09: qty 1

## 2019-03-09 MED ORDER — PROCHLORPERAZINE MALEATE 10 MG PO TABS: 10.0000 mg | ORAL_TABLET | Freq: Two times a day (BID) | ORAL | 0 refills | Status: DC | PRN

## 2019-03-09 NOTE — ED Triage Notes (Addendum)
C/o HA "since [place] here recently for same-states he feels he is still having withdrawal from klonopin withdrawal-NAD-steady gait

## 2019-03-09 NOTE — ED Provider Notes (Signed)
[facility] EMERGENCY DEPARTMENT Provider Note   CSN: [id number] Arrival date & time: 03/09/19  1539    History   Chief Complaint Chief Complaint  Patient presents with  . Headache    HPI Bryan Collins is a 53 y.o. male.     HPI   Headaches since January, have been present every day, strong headache has been continuous. Came to ED last week, had CT done which did not show acute abnormalities January had Lyme testing was negative Feeling sick for the last 3 years. Reports having classic Lyme symptoms except joint pain, fatigue, headaches, stomach issues. Has been ongoing. Nausea, no vomiting Diarrhea comes and goes, not regular.  2017 began to get sick, fatigue, abdominal pain, has been continuing, but then stomach improved and now has had continuous headaches  Does not drink etoh Smokes cigarettes, no other drugs Don't have a rx for clonopin but is taking one a week from his mom. Will sometimes take an extra one. Sometimes will go 10 days without it. Took it this past Tuesday.    Past Medical History:  Diagnosis Date  . Anxiety   . Asthma   . Barrett's esophagus   . Depression   . Diverticulitis   . Gastritis   . GERD (gastroesophageal reflux disease)   . Hiatal hernia   . HTN (hypertension)     Patient Active Problem List   Diagnosis Date Noted  . HLD (hyperlipidemia) 07/13/2017  . Groin pain, left 05/14/2017  . Polycythemia   . Tachycardia   . Prediabetes 05/13/2017  . 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 History of colonic polyps 02/10/2017  . Barrett's esophagus without dysplasia 02/10/2017  . Essential hypertension 06/25/2016  . IBS (irritable bowel syndrome) 06/16/2016  . Asthma, chronic 01/25/2015  . MDD (major depressive disorder) 01/25/2015  . GAD (generalized anxiety disorder) 01/25/2015  . Tobacco use disorder 01/25/2015  . Sebaceous cyst 01/25/2015  . Healthcare maintenance 01/25/2015    Past Surgical History:  Procedure  Laterality Date  . CHOLECYSTECTOMY          Home Medications    Prior to Admission medications   Medication Sig Start Date End Date Taking? Authorizing Provider  amLODipine (NORVASC) 5 MG tablet TAKE 1 TABLET BY MOUTH EVERY DAY 04/22/18   Bonnita Hollow, MD  bismuth subsalicylate (PEPTO BISMOL) 262 MG/15ML suspension Take 30 mLs by mouth every 6 (six) hours as needed for indigestion.    [provider]  clonazePAM (KLONOPIN) 0.5 MG tablet Take 1 tablet (0.5 mg total) by mouth every 6 (six) hours as needed for up to 14 days. for anxiety 06/08/18 06/22/18  Bonnita Hollow, MD  DEXILANT 60 MG capsule TAKE 1 CAPSULE BY MOUTH EVERY DAY 12/05/18   Mauri Pole, MD  dicyclomine (BENTYL) 20 MG tablet Take 1 tablet (20 mg total) by mouth 3 (three) times daily as needed for spasms (abdominal pain). 08/12/18   Mauri Pole, MD  famotidine (PEPCID) 20 MG tablet TAKE 1 TABLET BY MOUTH EVERYDAY AT BEDTIME 12/05/18   Mauri Pole, MD  linaclotide (LINZESS) 72 MCG capsule Take 1 capsule (72 mcg total) by mouth daily before breakfast. 08/12/18   Nandigam, Venia Minks, MD  prochlorperazine (COMPAZINE) 10 MG tablet Take 1 tablet (10 mg total) by mouth 2 (two) times daily as needed for nausea or vomiting (headache. May take with 25mg  of benadryl.). 03/09/19   Gareth Morgan, MD  ZOLOFT 100 MG tablet Take 2 tablets (  200 mg total) by mouth daily. 01/18/19   Bonnita Hollow, MD    Family History Family History  Problem Relation Age of Onset  . Breast cancer Mother   . Asthma Mother   . Gallstones Mother   . Heart attack Father   . Diabetes Father   . Prostate cancer Father   . Gallbladder disease Maternal Grandmother   . Stroke Maternal Grandmother   . Colon cancer Neg Hx   . Esophageal cancer Neg Hx   . Pancreatic cancer Neg Hx   . Rectal cancer Neg Hx   . Stomach cancer Neg Hx     Social History Social History   Tobacco Use  . Smoking status: Current Every Day  Smoker    Packs/day: 1.00    Years: 30.00    Pack years: 30.00    Types: Cigarettes    Start date: 01/22/1985  . Smokeless tobacco: Never Used  Substance Use Topics  . Alcohol use: Never    Frequency: Never  . Drug use: Not Currently     Allergies   Penicillins   Review of Systems Review of Systems  Constitutional: Positive for fatigue. Negative for fever.  HENT: Negative for sore throat.   Eyes: Negative for visual disturbance.  Respiratory: Negative for cough and shortness of breath.   Cardiovascular: Positive for chest pain and palpitations.  Gastrointestinal: Positive for abdominal pain, diarrhea, nausea and vomiting.  Genitourinary: Negative for difficulty urinating and dysuria.  Musculoskeletal: Negative for back pain and neck stiffness.  Skin: Negative for rash.  Neurological: Positive for headaches. Negative for syncope.     Physical Exam Updated Vital Signs BP 118/78 (BP Location: Right Arm)   Pulse 78   Temp 98.5 F (36.9 C) (Oral)   Resp 16   Ht 5\' 11"  (1.803 m)   Wt 117.9 kg   SpO2 99%   BMI 36.26 kg/m   Physical Exam Vitals signs and nursing note reviewed.  Constitutional:      General: He is not in acute distress.    Appearance: He is well-developed. He is not diaphoretic.  HENT:     Head: Normocephalic and atraumatic.  Eyes:     Conjunctiva/sclera: Conjunctivae normal.  Neck:     Musculoskeletal: Normal range of motion.  Cardiovascular:     Rate and Rhythm: Regular rhythm. Tachycardia present.     Heart sounds: Normal heart sounds. No murmur. No friction rub. No gallop.   Pulmonary:     Effort: Pulmonary effort is normal. No respiratory distress.     Breath sounds: Normal breath sounds.  Abdominal:     General: There is no distension.     Palpations: Abdomen is soft.     Tenderness: There is no abdominal tenderness. There is no guarding.  Skin:    General: Skin is warm and dry.  Neurological:     Mental Status: He is alert and  oriented to person, place, and time.      ED Treatments / Results  Labs (all labs ordered are listed, but only abnormal results are displayed) Labs Reviewed  CBC WITH DIFFERENTIAL/PLATELET - Abnormal; Notable for the following components:      Result Value   WBC 12.5 (*)    RBC 5.83 (*)    Hemoglobin 18.0 (*)    HCT 52.7 (*)    Neutro Abs 9.2 (*)    All other components within normal limits  COMPREHENSIVE METABOLIC PANEL - Abnormal; Notable for the following components:  Potassium 3.2 (*)    Glucose, Bld 142 (*)    All other components within normal limits  LIPASE, BLOOD - Abnormal; Notable for the following components:   Lipase 56 (*)    All other components within normal limits  URINALYSIS, ROUTINE W REFLEX MICROSCOPIC - Abnormal; Notable for the following components:   Protein, ur 30 (*)    All other components within normal limits  URINALYSIS, MICROSCOPIC (REFLEX) - Abnormal; Notable for the following components:   Bacteria, UA RARE (*)    All other components within normal limits  TROPONIN I (HIGH SENSITIVITY)  TROPONIN I (HIGH SENSITIVITY)    EKG None  Radiology Dg Chest Portable 1 View  Result Date: 03/09/2019 CLINICAL DATA:  Chest pain, heart palpitations, Klonopin withdraw. History of hypertension, asthma, smoker. EXAM: PORTABLE CHEST 1 VIEW COMPARISON:  Chest radiograph 02/28/2019, chest CT 06/03/2017 FINDINGS: Redemonstrated emphysematous changes through the lungs with coarse interstitial opacities. Are gradient opacity towards the lung bases likely related to the patient's body habitus. The cardiac silhouette is normal. No acute osseous or soft tissue abnormality IMPRESSION: No acute cardiopulmonary abnormality. Redemonstrated emphysematous changes in the lungs. Electronically Signed   By: Lovena Le M.D.   On: 03/09/2019 18:02    Procedures Procedures (including critical care time)  Medications Ordered in ED Medications  sodium chloride 0.9 % bolus  1,000 mL (0 mLs Intravenous Stopped 03/09/19 1819)  prochlorperazine (COMPAZINE) injection 10 mg (10 mg Intravenous Given 03/09/19 1717)  diphenhydrAMINE (BENADRYL) injection 25 mg (25 mg Intravenous Given 03/09/19 1716)  diazepam (VALIUM) injection 5 mg (5 mg Intravenous Given 03/09/19 1717)     Initial Impression / Assessment and Plan / ED Course  I have reviewed the triage vital signs and the nursing notes.  Pertinent labs & imaging results that were available during my care of the patient were reviewed by me and considered in my medical decision making (see chart for details).        53 year old male with a history of hypertension, diverticulitis, iliopsoas abscess, major depressive disorder, generalized anxiety disorder, history of prior suspected Klonopin dependence, presents with multiple concerns.  Patient reports that he has been sick for the last 3 years, but more specifically has had worsening symptoms over the last 6 months, including continuous headache, fatigue, nausea, abdominal pain. Also reports 6 mos of chest pain on ROS.  He was seen in the emergency department approximately 10 days ago with similar symptoms, had a CT completed of his head which showed no evidence of acute abnormalities.  Today, labs show decrease in his leukocytosis, normal renal function, mild hypokalemia.  Lipase mildly elevated, but not consistent with pancreatitis.  Urinalysis shows no sign of infection.  His abdominal exam is benign, have low suspicion for SBO, appendicitis, diverticulitis.  He is tachycardic on arrival, noted to have tachycardia also in the past. Denies taking regular klonopin at this time, and by history lower suspicion for withdrawal although cannot rule this out.    Given compazine, benadryl, valium and IV fluid with improvement in tachycardia and headache.   Recommend PCP follow up for multiple ongoing medical concerns. Placed case manager consult and gave number for [employer] and wellness. Given rx for compazine but discussed he may need other headache management through PCP or neurology. Patient discharged in stable condition with understanding of reasons to return.   Final Clinical Impressions(s) / ED Diagnoses   Final diagnoses:  Acute nonintractable headache, unspecified headache type  Abdominal  pain, unspecified abdominal location  Fatigue, unspecified type    ED Discharge Orders         Ordered    prochlorperazine (COMPAZINE) 10 MG tablet  2 times daily PRN     03/09/19 1856           Gareth Morgan, MD 03/10/19 0113

## 2019-03-09 NOTE — ED Notes (Signed)
ED Provider at bedside. MD stated not to draw 2nd troponin

## 2019-03-09 NOTE — ED Notes (Signed)
Pt placed on 5-lead and auto B/P

## 2019-03-21 NOTE — Progress Notes (Signed)
Patient ID: Bryan Collins, male   DOB: October 29, 1965, 53 y.o.   MRN: [id number]  Virtual Visit via Telephone Note  I connected with Bryan Collins on 03/22/19 at  2:30 PM EDT by telephone and verified that I am speaking with the correct person using two identifiers.   I discussed the limitations, risks, security and privacy concerns of performing an evaluation and management service by telephone and the availability of in person appointments. I also discussed with the patient that there may be a patient responsible charge related to this service. The patient expressed understanding and agreed to proceed.  Patient location:  home My Location:  [facility] office Persons on the call:  Me and the patient   History of Present Illness:  After being seen in the ED 7/7 and 03/09/2019 for daily HA since January 2020.  +hypokalemia and hyperglycemia on labs.  "H/o Lyme disease"(by patient history). He hasn't felt well for 3 years.  with extreme and "relentless" fatigue.  +Digestive problems, "brain fog."  No fever.  No joint pain.  Urgent care at [facility] tested him for Lyme disease about 1 month ago and it was negative.    Taking clonazepam about 1-2 times/ week from a prescription of his mom's.  He was taking much higher doses but says he hasn't been taking high doses in months.    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  Patient reports that he has been sick for the last 3 years, but more specifically has had worsening symptoms over the last 6 months, including continuous headache, fatigue, nausea, abdominal pain. Also reports 6 mos of chest pain on ROS.  He was seen in the emergency department approximately 10 days ago with similar symptoms, had a CT completed of his head which showed no evidence of acute  abnormalities.  Today, labs show decrease in his leukocytosis, normal renal function, mild hypokalemia.  Lipase mildly elevated, but not consistent with pancreatitis.  Urinalysis shows no sign of infection.  His abdominal exam is benign, have low suspicion for SBO, appendicitis, diverticulitis.  He is tachycardic on arrival, noted to have tachycardia also in the past. Denies taking regular klonopin at this time, and by history lower suspicion for withdrawal although cannot rule this out.    Given compazine, benadryl, valium and IV fluid with improvement in tachycardia and headache.   Recommend PCP follow up for multiple ongoing medical concerns. Placed case manager consult and gave number for [employer] and wellness. Given rx for compazine but discussed he may need other headache management through PCP or neurology. Patient discharged in stable condition with understanding of reasons to return.     Observations/Objective:  A&Ox3.  Slow but normal speech.  dperessed sounding affect.  Denies SI/HI   Assessment and Plan: 1. Fatigue, unspecified type - Basic metabolic panel; Future - Vitamin D, 25-hydroxy; Future  2. Hypokalemia  3. Leukocytosis, unspecified type - CBC with Differential/Platelet; Future  4. Hyperglycemia I have had a lengthy discussion and provided education about insulin resistance and the intake of too much sugar/refined carbohydrates.  I have advised the patient to work at a goal of eliminating sugary drinks, candy, desserts, sweets, refined sugars, processed foods, and white carbohydrates.  The patient expresses understanding.  - Hemoglobin A1c; Future    Follow Up Instructions: 3-4 weeks to assign PCP;  Lab  appt 2pm on Friday this week.     I discussed the assessment and treatment plan with the patient. The patient was provided an opportunity to ask questions and all were answered. The patient agreed with the plan and demonstrated an understanding of  the instructions.   The patient was advised to call back or seek an in-person evaluation if the symptoms worsen or if the condition fails to improve as anticipated.  I provided 16 minutes of non-face-to-face time during this encounter.   Freeman Caldron, PA-C

## 2019-03-22 ENCOUNTER — Other Ambulatory Visit: Payer: Self-pay

## 2019-03-22 ENCOUNTER — Ambulatory Visit: Payer: Medicaid Other | Attending: Family Medicine | Admitting: Physician Assistant

## 2019-03-22 DIAGNOSIS — R5383 Other fatigue: Secondary | ICD-10-CM

## 2019-03-22 DIAGNOSIS — E876 Hypokalemia: Secondary | ICD-10-CM

## 2019-03-22 DIAGNOSIS — Z09 Encounter for follow-up examination after completed treatment for conditions other than malignant neoplasm: Secondary | ICD-10-CM

## 2019-03-22 DIAGNOSIS — D72829 Elevated white blood cell count, unspecified: Secondary | ICD-10-CM

## 2019-03-22 DIAGNOSIS — R739 Hyperglycemia, unspecified: Secondary | ICD-10-CM

## 2019-03-22 NOTE — Progress Notes (Signed)
Pt. Is following up on hospital visit for headaches.  Pt. Stated he still have headache.

## 2019-03-23 ENCOUNTER — Telehealth: Payer: Self-pay | Admitting: General Practice

## 2019-03-23 NOTE — Telephone Encounter (Signed)
Attempted to call pt to sch appt; LVM informing to call back

## 2019-03-23 NOTE — Telephone Encounter (Signed)
-----   Message from [place], [place] sent at 03/23/2019  4:29 PM EDT ----- Please schedule   Return in about 1 month (around 04/22/2019) for assign PCP.

## 2019-03-24 ENCOUNTER — Telehealth: Payer: Self-pay | Admitting: General Practice

## 2019-03-24 ENCOUNTER — Ambulatory Visit: Payer: Medicaid Other | Attending: Family Medicine

## 2019-03-24 ENCOUNTER — Other Ambulatory Visit: Payer: Self-pay

## 2019-03-24 DIAGNOSIS — R739 Hyperglycemia, unspecified: Secondary | ICD-10-CM

## 2019-03-24 DIAGNOSIS — D72829 Elevated white blood cell count, unspecified: Secondary | ICD-10-CM

## 2019-03-24 DIAGNOSIS — R5383 Other fatigue: Secondary | ICD-10-CM

## 2019-03-24 NOTE — Telephone Encounter (Signed)
Denied. Will send alternative and refer to neurology

## 2019-03-24 NOTE — Telephone Encounter (Signed)
1) Medication(s) Requested (by name): prochlorperazine (COMPAZINE) 10 MG tablet   2) Pharmacy of Choice: [facility]) Special Requests:   Approved medications will be sent to the pharmacy, we will reach out if there is an issue.  Requests made after 3pm may not be addressed until the following business day!  If a patient is unsure of the name of the medication(s) please note and ask patient to call back when they are able to provide all info, do not send to responsible party until all information is available!

## 2019-03-24 NOTE — Addendum Note (Signed)
Addended by: Frederich Cha on: 03/24/2019 03:03 PM   Modules accepted: Orders

## 2019-03-25 LAB — CBC WITH DIFFERENTIAL/PLATELET
Basophils Absolute: 0.1 10*3/uL (ref 0.0–0.2)
Basos: 1 %
EOS (ABSOLUTE): 0.2 10*3/uL (ref 0.0–0.4)
Eos: 1 %
Hematocrit: 54 % — ABNORMAL HIGH (ref 37.5–51.0)
Hemoglobin: 18.7 g/dL — ABNORMAL HIGH (ref 13.0–17.7)
Immature Grans (Abs): 0.1 10*3/uL (ref 0.0–0.1)
Immature Granulocytes: 1 %
Lymphocytes Absolute: 3.5 10*3/uL — ABNORMAL HIGH (ref 0.7–3.1)
Lymphs: 22 %
MCH: 31.1 pg (ref 26.6–33.0)
MCHC: 34.6 g/dL (ref 31.5–35.7)
MCV: 90 fL (ref 79–97)
Monocytes Absolute: 1.1 10*3/uL — ABNORMAL HIGH (ref 0.1–0.9)
Monocytes: 7 %
Neutrophils Absolute: 10.6 10*3/uL — ABNORMAL HIGH (ref 1.4–7.0)
Neutrophils: 68 %
Platelets: 190 10*3/uL (ref 150–450)
RBC: 6.01 x10E6/uL — ABNORMAL HIGH (ref 4.14–5.80)
RDW: 13.1 % (ref 11.6–15.4)
WBC: 15.5 10*3/uL — ABNORMAL HIGH (ref 3.4–10.8)

## 2019-03-25 LAB — BASIC METABOLIC PANEL
BUN/Creatinine Ratio: 7 — ABNORMAL LOW (ref 9–20)
BUN: 9 mg/dL (ref 6–24)
CO2: 21 mmol/L (ref 20–29)
Calcium: 9.5 mg/dL (ref 8.7–10.2)
Chloride: 101 mmol/L (ref 96–106)
Creatinine, Ser: 1.25 mg/dL (ref 0.76–1.27)
GFR calc Af Amer: 75 mL/min/{1.73_m2} (ref >59)
GFR calc non Af Amer: 65 mL/min/{1.73_m2} (ref >59)
Glucose: 106 mg/dL — ABNORMAL HIGH (ref 65–99)
Potassium: 3.8 mmol/L (ref 3.5–5.2)
Sodium: 141 mmol/L (ref 134–144)

## 2019-03-25 LAB — VITAMIN D 25 HYDROXY (VIT D DEFICIENCY, FRACTURES): Vit D, 25-Hydroxy: 20.2 ng/mL — ABNORMAL LOW (ref 30.0–100.0)

## 2019-03-25 LAB — HEMOGLOBIN A1C
Est. average glucose Bld gHb Est-mCnc: 114 mg/dL
Hgb A1c MFr Bld: 5.6 % (ref 4.8–5.6)

## 2019-03-27 ENCOUNTER — Telehealth: Payer: Self-pay | Admitting: General Practice

## 2019-03-27 NOTE — Telephone Encounter (Signed)
Attempted to reach patient to inform. No answer unable to LVM

## 2019-03-27 NOTE — Telephone Encounter (Signed)
Patient states he would like to speak to someone in regards to his neurology referral. Please follow up.

## 2019-03-28 ENCOUNTER — Other Ambulatory Visit: Payer: Self-pay | Admitting: General Practice

## 2019-03-28 ENCOUNTER — Other Ambulatory Visit: Payer: Self-pay | Admitting: Physician Assistant

## 2019-03-28 MED ORDER — VITAMIN D (ERGOCALCIFEROL) 1.25 MG (50000 UNIT) PO CAPS: 50000.0000 [IU] | ORAL_CAPSULE | ORAL | 0 refills | Status: DC

## 2019-03-28 NOTE — Telephone Encounter (Signed)
1) Medication(s) Requested (by name): prochlorperazine (COMPAZINE) 10 MG tablet [[id number]]  2) Pharmacy of Choice:  [facility] [address]) Special Requests:   Approved medications will be sent to the pharmacy, we will reach out if there is an issue.  Requests made after 3pm may not be addressed until the following business day!  If a patient is unsure of the name of the medication(s) please note and ask patient to call back when they are able to provide all info, do not send to responsible party until all information is available!

## 2019-03-28 NOTE — Telephone Encounter (Signed)
When calling patient to inform of results. Patient asked about refill on COMPAZINE. Informed of the message per Mrs. Raul Del, [place]-  that the request to have refilled was denied and will send an alternative and refer to Neurology.   Call was disconnected by the patient.     __________________________________ Notes recorded by Carilyn Goodpasture, RN on 03/28/2019 at 3:40 PM EDT  Pt name and DOB verified. Patient aware of results and result note per Freeman Caldron, PA-C   ------   Notes recorded by Argentina Donovan, PA-C on 03/28/2019 at 11:47 AM EDT  Your vitamin D is low. This can contribute to muscle aches, anxiety, fatigue, and depression. I have sent a prescription to the pharmacy for you to take once a week. We will recheck this level in 3-4 months. Drink more water as your liver function and blood count are slightly elevated. Avoid alcohol and tylenol. Labs do not show diabetes, but you are borderline. Work to eliminate sugars and white carbohydrates from your diet. Follow-up as planned. Thanks, Freeman Caldron, PA-C

## 2019-04-04 ENCOUNTER — Other Ambulatory Visit: Payer: Self-pay | Admitting: Gastroenterology

## 2019-04-10 ENCOUNTER — Other Ambulatory Visit: Payer: Self-pay | Admitting: *Deleted

## 2019-04-10 MED ORDER — DEXILANT 60 MG PO CPDR: 60.0000 mg | DELAYED_RELEASE_CAPSULE | Freq: Every day | ORAL | 3 refills | Status: DC

## 2019-04-18 ENCOUNTER — Other Ambulatory Visit: Payer: Self-pay

## 2019-04-18 ENCOUNTER — Ambulatory Visit: Payer: Medicaid Other | Attending: Nurse Practitioner | Admitting: Nurse Practitioner

## 2019-04-18 ENCOUNTER — Encounter: Payer: Self-pay | Admitting: Nurse Practitioner

## 2019-04-18 DIAGNOSIS — G43009 Migraine without aura, not intractable, without status migrainosus: Secondary | ICD-10-CM

## 2019-04-18 DIAGNOSIS — I10 Essential (primary) hypertension: Secondary | ICD-10-CM | POA: Diagnosis not present

## 2019-04-18 DIAGNOSIS — F3341 Major depressive disorder, recurrent, in partial remission: Secondary | ICD-10-CM

## 2019-04-18 DIAGNOSIS — F17210 Nicotine dependence, cigarettes, uncomplicated: Secondary | ICD-10-CM | POA: Diagnosis not present

## 2019-04-18 DIAGNOSIS — F17200 Nicotine dependence, unspecified, uncomplicated: Secondary | ICD-10-CM

## 2019-04-18 DIAGNOSIS — F411 Generalized anxiety disorder: Secondary | ICD-10-CM

## 2019-04-18 MED ORDER — TOPIRAMATE 50 MG PO TABS: 50.0000 mg | ORAL_TABLET | Freq: Every day | ORAL | 1 refills | Status: DC

## 2019-04-18 MED ORDER — AMLODIPINE BESYLATE 5 MG PO TABS: 5.0000 mg | ORAL_TABLET | Freq: Every day | ORAL | 0 refills | Status: DC

## 2019-04-18 MED ORDER — HYDROXYZINE HCL 25 MG PO TABS: 25.0000 mg | ORAL_TABLET | Freq: Three times a day (TID) | ORAL | 1 refills | Status: AC | PRN

## 2019-04-18 NOTE — Progress Notes (Signed)
Virtual Visit via Telephone Note Due to national recommendations of social distancing due to [place] 19, telehealth visit is felt to be most appropriate for this patient at this time.  I discussed the limitations, risks, security and privacy concerns of performing an evaluation and management service by telephone and the availability of in person appointments. I also discussed with the patient that there may be a patient responsible charge related to this service. The patient expressed understanding and agreed to proceed.    I connected with Bryan Collins on 04/18/19  at   8:50 AM EDT  EDT by telephone and verified that I am speaking with the correct person using two identifiers.   Consent I discussed the limitations, risks, security and privacy concerns of performing an evaluation and management service by telephone and the availability of in person appointments. I also discussed with the patient that there may be a patient responsible charge related to this service. The patient expressed understanding and agreed to proceed.   Location of Patient: Private Residence   Location of Provider: [place] and [employer] Office    Persons participating in Telemedicine visit: Geryl Rankins FNP-BC [place]    History of Present Illness: Telemedicine visit for: Establish care  has a past medical history of Anxiety, Barrett's esophagus, Depression, Diverticulitis, Gastritis, GERD (gastroesophageal reflux disease), Hiatal hernia, and HTN (hypertension).   Migraines Chronic. Started in January. He has not been taking any prescription medications. He does drink caffeine "Drink coffee all day long". Migraine pain occurring behind the eyes and at the top of his head. He does also endorse nausea with his migraine onset. His last eye exam was "a long time ago".  He does not have a blood pressure monitor at home so does not usually monitor his blood pressure although he does  have high blood pressure and he continues to smoke.  He believes he is going through klonopin withdrawal and that's why he has migraines. He stopped getting prescriptions for Klonopin in October 2019. Still taking 2-3 a week from his mother's prescriptions.    Essential Hypertension Denies chest pain, shortness of breath, palpitations, lightheadedness, dizziness, headaches or BLE edema. Taking amlodipine 5 mg daily as prescribed.  BP Readings from Last 3 Encounters:  03/09/19 118/78  02/28/19 121/89  08/12/18 126/82    Anxiety and Depression Taking zoloft 200 mg daily. Endorses improvement in mood lability. Does not feel it helps much for his anxiety.  Depression screen [facility] 2/9 04/18/2019 07/06/2017 05/27/2017 05/14/2017 05/06/2017  Decreased Interest 0 3 1 1  0  Down, Depressed, Hopeless 3 3 1 1  0  PHQ - 2 Score 3 6 2 2  0  Altered sleeping 3 3 1 1  -  Tired, decreased energy 2 3 1 1  -  Change in appetite 1 3 1 1  -  Feeling bad or failure about yourself  2 3 1 1  -  Trouble concentrating 0 3 1 1  -  Moving slowly or fidgety/restless 0 1 0 0 -  Suicidal thoughts 0 0 0 0 -  PHQ-9 Score 11 22 7 7  -  Difficult doing work/chores - Extremely dIfficult - Extremely dIfficult -   GAD 7 : Generalized Anxiety Score 04/18/2019 07/06/2017 03/23/2016  Nervous, Anxious, on Edge 2 3 (No Data)  Control/stop worrying 1 3 -  Worry too much - different things 1 3 -  Trouble relaxing 1 3 -  Restless 1 3 -  Easily annoyed or irritable 3 3 -  Afraid - awful might happen 3 3 -  Total GAD 7 Score 12 21 -  Anxiety Difficulty - Extremely difficult -    Past Medical History:  Diagnosis Date  . Anxiety   . Barrett's esophagus   . Depression   . Diverticulitis   . Gastritis   . GERD (gastroesophageal reflux disease)   . Hiatal hernia   . HTN (hypertension)     Past Surgical History:  Procedure Laterality Date  . CHOLECYSTECTOMY      Family History  Problem Relation Age of Onset  . Breast cancer  Mother   . Asthma Mother   . Gallstones Mother   . Heart attack Father   . Diabetes Father   . Prostate cancer Father   . Gallbladder disease Maternal Grandmother   . Stroke Maternal Grandmother   . Colon cancer Neg Hx   . Esophageal cancer Neg Hx   . Pancreatic cancer Neg Hx   . Rectal cancer Neg Hx   . Stomach cancer Neg Hx     Social History   Socioeconomic History  . Marital status: Single    Spouse name: Not on file  . Number of children: 0  . Years of education: Not on file  . Highest education level: Not on file  Occupational History  . Occupation: disabled  Social Needs  . Financial resource strain: Not on file  . Food insecurity    Worry: Not on file    Inability: Not on file  . Transportation needs    Medical: Not on file    Non-medical: Not on file  Tobacco Use  . Smoking status: Current Every Day Smoker    Packs/day: 1.00    Years: 30.00    Pack years: 30.00    Types: Cigarettes    Start date: 01/22/1985  . Smokeless tobacco: Never Used  Substance and Sexual Activity  . Alcohol use: Never    Frequency: Never  . Drug use: Not Currently  . Sexual activity: Not Currently  Lifestyle  . Physical activity    Days per week: Not on file    Minutes per session: Not on file  . Stress: Not on file  Relationships  . Social Herbalist on phone: Not on file    Gets together: Not on file    Attends religious service: Not on file    Active member of club or organization: Not on file    Attends meetings of clubs or organizations: Not on file    Relationship status: Not on file  Other Topics Concern  . Not on file  Social History Narrative   Lives with mother.     Observations/Objective: Awake, alert and oriented x 3   Review of Systems  Constitutional: Negative for fever, malaise/fatigue and weight loss.  HENT: Negative.  Negative for nosebleeds.   Eyes: Negative.  Negative for blurred vision, double vision and photophobia.  Respiratory:  Negative.  Negative for cough and shortness of breath.   Cardiovascular: Negative.  Negative for chest pain, palpitations and leg swelling.  Gastrointestinal: Positive for nausea. Negative for abdominal pain, blood in stool, constipation, diarrhea, heartburn, melena and vomiting.  Musculoskeletal: Negative.  Negative for myalgias.  Neurological: Positive for headaches. Negative for dizziness, focal weakness and seizures.  Psychiatric/Behavioral: Positive for depression. Negative for suicidal ideas. The patient is nervous/anxious.     Assessment and Plan:   Diagnoses and all orders for this visit:  Essential hypertension -  amLODipine (NORVASC) 5 MG tablet; Take 1 tablet (5 mg total) by mouth daily. Continue all antihypertensives as prescribed.  Remember to bring in your blood pressure log with you for your follow up appointment.  DASH/Mediterranean Diets are healthier choices for HTN.    Recurrent major depressive disorder, in partial remission (HCC) Continue zoloft as instructed  Migraine without aura and without status migrainosus, not intractable -     topiramate (TOPAMAX) 50 MG tablet; Take 1 tablet (50 mg total) by mouth daily. May increase to 2 (two) tablets by mouth daily after one week if needed  GAD (generalized anxiety disorder) -     hydrOXYzine (ATARAX/VISTARIL) 25 MG tablet; Take 1 tablet (25 mg total) by mouth 3 (three) times daily as needed for anxiety. Wean totally off klonopin. Stop taking other people's medications.    Tobacco Dependence Manard was counseled on the dangers of tobacco use, and was advised to quit. Reviewed strategies to maximize success, including removing cigarettes and smoking materials from environment, stress management and support of family/friends as well as pharmacological alternatives including: Wellbutrin, Chantix, Nicotine patch, Nicotine gum or lozenges. Smoking cessation support: smoking cessation hotline: 1-800-QUIT-NOW.  Smoking  cessation classes are also available through [facility] and [facility]. Call [phone] or visit our website at [email or web address]   A total of 3 minutes was spent on counseling for smoking cessation and Buck is not ready to quit.    Follow Up Instructions Return in about 4 weeks (around 05/16/2019).     I discussed the assessment and treatment plan with the patient. The patient was provided an opportunity to ask questions and all were answered. The patient agreed with the plan and demonstrated an understanding of the instructions.   The patient was advised to call back or seek an in-person evaluation if the symptoms worsen or if the condition fails to improve as anticipated.  I provided 23 minutes of non-face-to-face time during this encounter including median intraservice time, reviewing previous notes, labs, imaging, medications and explaining diagnosis and management.  Gildardo Pounds, FNP-BC

## 2019-04-20 ENCOUNTER — Other Ambulatory Visit: Payer: Self-pay | Admitting: Nurse Practitioner

## 2019-04-20 ENCOUNTER — Other Ambulatory Visit: Payer: Medicaid Other

## 2019-04-20 DIAGNOSIS — D72829 Elevated white blood cell count, unspecified: Secondary | ICD-10-CM

## 2019-04-20 DIAGNOSIS — Z13220 Encounter for screening for lipoid disorders: Secondary | ICD-10-CM

## 2019-04-20 DIAGNOSIS — Z125 Encounter for screening for malignant neoplasm of prostate: Secondary | ICD-10-CM

## 2019-04-21 ENCOUNTER — Telehealth: Payer: Self-pay | Admitting: *Deleted

## 2019-04-21 ENCOUNTER — Encounter: Payer: Self-pay | Admitting: Nurse Practitioner

## 2019-04-21 DIAGNOSIS — F3341 Major depressive disorder, recurrent, in partial remission: Secondary | ICD-10-CM | POA: Insufficient documentation

## 2019-04-21 NOTE — Telephone Encounter (Signed)
Per pt his Topamax is not working with further explanation. Per pt he was in the ER and remembered that the providers there gived him Compazine for his headache and it did work. Pt would like to know if provider could please prescribe him Compazine. Patient number is [phone].

## 2019-04-23 ENCOUNTER — Other Ambulatory Visit: Payer: Self-pay | Admitting: Nurse Practitioner

## 2019-04-23 DIAGNOSIS — G43009 Migraine without aura, not intractable, without status migrainosus: Secondary | ICD-10-CM

## 2019-04-23 MED ORDER — SUMATRIPTAN SUCCINATE 100 MG PO TABS: 100.0000 mg | ORAL_TABLET | Freq: Once | ORAL | 0 refills | Status: DC

## 2019-04-23 NOTE — Telephone Encounter (Signed)
I do not prescribe compazine for headaches. I have referred him to the headache clinic and have also sent imitrex to the pharmacy for him to take.

## 2019-04-24 ENCOUNTER — Other Ambulatory Visit: Payer: Self-pay | Admitting: Nurse Practitioner

## 2019-04-24 DIAGNOSIS — I10 Essential (primary) hypertension: Secondary | ICD-10-CM

## 2019-04-27 NOTE — Telephone Encounter (Signed)
CMA attempt to reach patient to inform on PCP advising.   No answer and LVM for call back.

## 2019-04-28 ENCOUNTER — Telehealth: Payer: Self-pay | Admitting: *Deleted

## 2019-04-28 MED ORDER — SUMATRIPTAN SUCCINATE 100 MG PO TABS: 100.0000 mg | ORAL_TABLET | Freq: Once | ORAL | 0 refills | Status: DC

## 2019-04-28 NOTE — Telephone Encounter (Signed)
I have refilled Sumatriptan which appears on his med list

## 2019-04-28 NOTE — Telephone Encounter (Signed)
Patient called in to request medication refill for headache medications- Compazine and Sumatriptan. He states he needs enough to take until he can see the headache specialist on next week. He has an appointment on 05/02/2019. He reports he took his last dose yesterday.   He states he received Compazine in the ED and this helped his headache.

## 2019-05-02 ENCOUNTER — Ambulatory Visit: Payer: Medicaid Other | Admitting: Neurology

## 2019-05-02 ENCOUNTER — Encounter: Payer: Self-pay | Admitting: Neurology

## 2019-05-02 ENCOUNTER — Other Ambulatory Visit: Payer: Self-pay

## 2019-05-02 VITALS — BP 163/105 | HR 128 | Temp 98.0°F | Ht 70.0 in | Wt 253.0 lb

## 2019-05-02 DIAGNOSIS — R51 Headache: Secondary | ICD-10-CM | POA: Diagnosis not present

## 2019-05-02 DIAGNOSIS — Z789 Other specified health status: Secondary | ICD-10-CM

## 2019-05-02 DIAGNOSIS — G4719 Other hypersomnia: Secondary | ICD-10-CM | POA: Diagnosis not present

## 2019-05-02 DIAGNOSIS — G4489 Other headache syndrome: Secondary | ICD-10-CM

## 2019-05-02 DIAGNOSIS — R519 Headache, unspecified: Secondary | ICD-10-CM

## 2019-05-02 DIAGNOSIS — R351 Nocturia: Secondary | ICD-10-CM

## 2019-05-02 MED ORDER — PROCHLORPERAZINE MALEATE 10 MG PO TABS: 10.0000 mg | ORAL_TABLET | Freq: Two times a day (BID) | ORAL | 0 refills | Status: AC | PRN

## 2019-05-02 MED ORDER — SUMATRIPTAN SUCCINATE 50 MG PO TABS: ORAL_TABLET | ORAL | 1 refills | Status: DC

## 2019-05-02 NOTE — Progress Notes (Signed)
Subjective:    Patient ID: Bryan Collins is a 53 y.o. male.  HPI     Star Age, MD, PhD [facility] [address] [place], [place] [address]  Dear Army Melia,  I saw your patient, Bryan Collins, upon your kind request in neurologic clinic today for initial consultation of his recurrent headaches.  The patient is unaccompanied today.  As you know, Bryan Collins is a 53 year old right-handed gentleman with an underlying medical history of depression, anxiety, history of gastritis, reflux disease with hiatal hernia, hypertension and obesity, who reports recurrent headaches since January 2020.  He reports that the headache location is variable, sometimes on the top of his head, sometimes the back or the right side.  It typically is a squeezing and aching type sensation, some nausea, typically no vomiting, he does feel sensitive to light.  Sometimes or rarely it is a throbbing sensation. I reviewed your telemed visit note from 04/18/2019.  He was started on topiramate 50 mg strength at the time.  He is currently taking not taking any, tried 1 pill but became afraid of taking it after his brother told him that it is a mood stabilizer and his brother had blurry vision from it.  The patient did not have any side effects after taking 1 pill and would be willing to restart it.  He has woken up with a headache.  Sleep is interrupted and he has a erratic sleep schedule, it is not impossible for him to go to bed in the afternoon and wake up the next morning.  He sleeps 10 to 12 hours a day.  He is very fatigued.  He felt that his fatigue could be from Lyme disease, he was checked out for this.  He indicates a low Epworth sleepiness score but sleeps a lot during the day.  Sleep is also interrupted by nocturia which is about 3 times per average night.  He admits to drinking hardly any water, typically no water and reports that his mother wants him to drink more water.  He  drinks coffee all day he admits, estimates that he drinks at least 5 or 6 cups of coffee per day, no sodas.  He does not drink alcohol and does not use any illicit drugs.  He smokes 1 pack/day and is currently not working on smoking cessation.  He has been on disability since 2012.  He has had more anxiety lately, he has not been on clonazepam for some time, he does not have a prescription any longer he says.  He has not had an eye examination and years.  He is not sure if he snores.  He does not have a family history of migraines.  He has never had a sleep study, he has never had a brain MRI. His Epworth sleepiness score is 3 out of 24, fatigue severity score is 63 out of 63.  He had a head CT without contrast on 02/28/2019 and I reviewed the results: IMPRESSION: No acute intracranial findings or mass lesion. He denies any one-sided weakness or numbness or tingling or droopy face or slurring of speech.   His Past Medical History Is Significant For: Past Medical History:  Diagnosis Date  . Anxiety   . Barrett's esophagus   . Depression   . Diverticulitis   . Gastritis   . GERD (gastroesophageal reflux disease)   . Hiatal hernia   . HTN (hypertension)     His Past Surgical History  Is Significant For: Past Surgical History:  Procedure Laterality Date  . CHOLECYSTECTOMY      His Family History Is Significant For: Family History  Problem Relation Age of Onset  . Breast cancer Mother   . Asthma Mother   . Gallstones Mother   . Heart attack Father   . Diabetes Father   . Prostate cancer Father   . Congestive Heart Failure Father   . Gallbladder disease Maternal Grandmother   . Stroke Maternal Grandmother   . Colon cancer Neg Hx   . Esophageal cancer Neg Hx   . Pancreatic cancer Neg Hx   . Rectal cancer Neg Hx   . Stomach cancer Neg Hx     His Social History Is Significant For: Social History   Socioeconomic History  . Marital status: Single    Spouse name: Not on file  .  Number of children: 0  . Years of education: Not on file  . Highest education level: Not on file  Occupational History  . Occupation: disabled  Social Needs  . Financial resource strain: Not on file  . Food insecurity    Worry: Not on file    Inability: Not on file  . Transportation needs    Medical: Not on file    Non-medical: Not on file  Tobacco Use  . Smoking status: Current Every Day Smoker    Packs/day: 1.00    Years: 30.00    Pack years: 30.00    Types: Cigarettes    Start date: 01/22/1985  . Smokeless tobacco: Never Used  Substance and Sexual Activity  . Alcohol use: Never    Frequency: Never  . Drug use: Not Currently  . Sexual activity: Not Currently  Lifestyle  . Physical activity    Days per week: Not on file    Minutes per session: Not on file  . Stress: Not on file  Relationships  . Social Herbalist on phone: Not on file    Gets together: Not on file    Attends religious service: Not on file    Active member of club or organization: Not on file    Attends meetings of clubs or organizations: Not on file    Relationship status: Not on file  Other Topics Concern  . Not on file  Social History Narrative   Lives with mother.    His Allergies Are:  Allergies  Allergen Reactions  . Penicillins Hives    Pt's identical twin had reaction  :   His Current Medications Are:  Outpatient Encounter Medications as of 05/02/2019  Medication Sig  . amLODipine (NORVASC) 5 MG tablet Take 1 tablet (5 mg total) by mouth daily.  . famotidine (PEPCID) 20 MG tablet TAKE 1 TABLET BY MOUTH EVERYDAY AT BEDTIME  . hydrOXYzine (ATARAX/VISTARIL) 25 MG tablet Take 1 tablet (25 mg total) by mouth 3 (three) times daily as needed for anxiety.  . SUMAtriptan (IMITREX) 100 MG tablet Take 1 tablet (100 mg total) by mouth once for 1 dose. May repeat in 2 hours if headache persists or recurs.  . topiramate (TOPAMAX) 50 MG tablet Take 1 tablet (50 mg total) by mouth daily. May  increase to 2 (two) tablets by mouth daily after one week if needed  . Vitamin D, Ergocalciferol, (DRISDOL) 1.25 MG (50000 UT) CAPS capsule Take 1 capsule (50,000 Units total) by mouth every 7 (seven) days. (Patient not taking: Reported on 04/18/2019)  . ZOLOFT 100 MG tablet Take  2 tablets (200 mg total) by mouth daily.   No facility-administered encounter medications on file as of 05/02/2019.   :  Review of Systems:  Out of a complete 14 point review of systems, all are reviewed and negative with the exception of these symptoms as listed below: Review of Systems  Neurological:       New pt; here to discuss migraines. Pt reports daily migraines. Does not report any sleep discomfort snoring, gasping for breath (ect).   Epworth Sleepiness Scale 0= would never doze 1= slight chance of dozing 2= moderate chance of dozing 3= high chance of dozing  Sitting and reading:0 Watching TV:0 Sitting inactive in a public place (ex. Theater or meeting):0 As a passenger in a car for an hour without a break:0 Lying down to rest in the afternoon:3 Sitting and talking to someone:0 Sitting quietly after lunch (no alcohol):0 In a car, while stopped in traffic:0 Total: 3     Objective:  Neurological Exam  Physical Exam Physical Examination:   Vitals:   05/02/19 0958  BP: (!) 163/105  Pulse: (!) 128  Temp: 98 F (36.7 C)   General Examination: The patient is a very pleasant 53 y.o. male in no acute distress. He appears well-developed and well-nourished and well groomed. He appears nervous and somewhat fidgety, keeps moving his thighs side to side.  HEENT: Normocephalic, atraumatic, pupils are equal, round and reactive to light and accommodation. Funduscopic exam is normal with sharp disc margins noted. Extraocular tracking is good without limitation to gaze excursion or nystagmus noted. Normal smooth pursuit is noted. Hearing is grossly intact. Face is symmetric with normal facial animation and  normal facial sensation. Speech is clear with no dysarthria noted. There is no hypophonia. There is no lip, neck/head, jaw or voice tremor. Neck is supple with full range of passive and active motion. There are no carotid bruits on auscultation. Oropharynx exam reveals: moderate mouth dryness, adequate dental hygiene and moderate airway crowding, due to Slightly smaller airway opening, tonsillar size of about 1+, Mallampati class II.  Tongue protrudes centrally in palate elevates symmetrically.   Chest: Clear to auscultation without wheezing, rhonchi or crackles noted.  Heart: S1+S2+0, regular and normal without murmurs, rubs or gallops noted.   Abdomen: Soft, non-tender and non-distended with normal bowel sounds appreciated on auscultation.  Extremities: There is no pitting edema in the distal lower extremities bilaterally. Pedal pulses are intact.  Skin: Warm and dry without trophic changes noted. There are no varicose veins.  Musculoskeletal: exam reveals no obvious joint deformities, tenderness or joint swelling or erythema.   Neurologically:  Mental status: The patient is awake, alert and oriented in all 4 spheres. His immediate and remote memory, attention, language skills and fund of knowledge are appropriate. There is no evidence of aphasia, agnosia, apraxia or anomia. Speech is clear with normal prosody and enunciation. Thought process is linear. Mood is normal and affect is normal.  Cranial nerves II - XII are as described above under HEENT exam. In addition: shoulder shrug is normal with equal shoulder height noted. Motor exam: Normal bulk, strength and tone is noted. There is no drift, tremor or rebound. Romberg is negative. Reflexes are 2+ throughout. Babinski: Toes are flexor bilaterally. Fine motor skills and coordination: intact with normal finger taps, normal hand movements, normal rapid alternating patting, normal foot taps and normal foot agility.  Cerebellar testing: No  dysmetria or intention tremor on finger to nose testing. Heel to shin is unremarkable  bilaterally. There is no truncal or gait ataxia.  Sensory exam: intact to light touch, vibration, temperature sense in the upper and lower extremities.  Gait, station and balance: He stands easily. No veering to one side is noted. No leaning to one side is noted. Posture is age-appropriate and stance is narrow based. Gait shows normal stride length and normal pace. No problems turning are noted. Tandem walk is unremarkable.               Assessment and Plan:   In summary, PRINTES LUTSKY is a very pleasant 53 y.o.-year old male with an underlying medical history of depression, anxiety, history of gastritis, reflux disease with hiatal hernia, hypertension and obesity, who Presents for evaluation of his recurrent headaches.  He has had headaches since January 2020.  While some of the headaches may be migrainous, his history and examination suggest more of a combination of headaches including headaches associated with excess caffeine, suboptimal hydration, irregular sleep habits and concern for underlying obstructive sleep apnea.  We talked at length about his headaches today and his examination.  He is strongly advised to quit smoking.  He is highly encouraged to add water to his day-to-day fluids, 6 to 8 cups/day.  He is advised to keep a more set schedule for his sleep.  In addition, he is advised to reduce his caffeine intake and limit himself to 2, perhaps 3 cups of coffee per day.  His neurological exam is nonfocal.  He has not had an eye examination in some years and is advised to seek a full eye examination with an optometrist.  His blood pressure is elevated and could be a contributor to his headaches.  We will proceed with diagnostic testing in the form of brain MRI with and without contrast to rule out a structural cause of his headaches.  In addition, I would like to proceed with a sleep study to rule out underlying  obstructive sleep apnea.  He has a prescription for topiramate.  He is encouraged to start using it and we talked about expectations with daily headache preventions. If he wants to start at a lower dose, he can certainly start with half a pill at night and then increase gradually as instructed.  He is requesting a prescription for Compazine.  He is cautioned regarding the repeated use of Compazine secondary to the sedating properties of the medication.  It is not for daily use, I provided a prescription for 10 pills with no refills at this time. He can continue with as needed use of Imitrex, I clarified that it is for as needed use for migrainous headaches only and provided a new prescription for this.  He is advised to follow-up in 3 months, sooner if needed.  We will keep him posted as to his MRI results and sleep study results in the interim by phone call.  I answered all his questions today and he was in agreement. Thank you very much for allowing me to participate in the care of this nice patient. If I can be of any further assistance to you please do not hesitate to call me at [phone].  Sincerely,   Star Age, MD, PhD

## 2019-05-02 NOTE — Patient Instructions (Addendum)
I think your headaches are of a mixed pattern and nature, meaning, that there is no single cause for this, you may have some migrainous headaches but also caffeine overuse may be at play and suboptimal hydration, poor sleep habits with possible underlying obstructive sleep apnea.  I would like to proceed with a brain MRI with and without contrast to rule out a structural cause of your headaches and a sleep study to rule out obstructive sleep apnea.  I would recommend that you increase your water intake to 6 to 8 cups/day if possible and reduce your caffeine intake to limit yourself to about 2 cups of coffee if possible.  Please try to quit smoking as well.  You can use Imitrex 50 mg strength as needed for a migraine attack, you may repeat 1 pill after 2 hours, No more than 2 pills in 24 hours, no more than 3 pills in a week.  Please also use Compazine for nausea only if needed, I will prescribe 10 pills with no refills.  Please go ahead and try the topiramate as prescribed by Geryl Rankins, nurse practitioner, you can start with half a pill each night, it is a daily medication for headache prevention.

## 2019-05-03 ENCOUNTER — Telehealth: Payer: Self-pay | Admitting: Neurology

## 2019-05-03 ENCOUNTER — Telehealth: Payer: Self-pay

## 2019-05-03 NOTE — Telephone Encounter (Signed)
Medicaid Josem KaufmannJE:[id number] (exp. 05/03/19 to 10/30/19) order sent to GI. They will reach out to the patient to schedule.

## 2019-05-03 NOTE — Telephone Encounter (Signed)
PA completed for imitrex, sent to [facility]. Should have a determination in 24 hours. Confirmation #[id number] W

## 2019-05-04 MED ORDER — RIZATRIPTAN BENZOATE 10 MG PO TBDP: 10.0000 mg | ORAL_TABLET | ORAL | 3 refills | Status: DC | PRN

## 2019-05-04 NOTE — Telephone Encounter (Signed)
PA for imitrex denied by [facility] Medicaid. Preferred drugs are rizatriptan ODT or tablet.

## 2019-05-04 NOTE — Telephone Encounter (Signed)
I called pt to discuss. No answer, left a message asking him to call me back. 

## 2019-05-04 NOTE — Addendum Note (Signed)
Addended by: Lester [employer] A on: 05/04/2019 02:03 PM   Modules accepted: Orders

## 2019-05-04 NOTE — Telephone Encounter (Signed)
Pt has called back stating his mother would like to know if RN Cyril Mourning can call in another refill of the prochlorperazine (COMPAZINE) 10 MG tablet, since he is down to 7 and it was a script only for 10

## 2019-05-04 NOTE — Addendum Note (Signed)
Addended by: Star Age on: 05/04/2019 01:48 PM   Modules accepted: Orders

## 2019-05-04 NOTE — Telephone Encounter (Signed)
I called pt, advised him that Dr. Rexene Alberts only gave 10 tablets with no refills. I recommended that he establish care with a PCP for further refills. Pt verbalized understanding of recommendations.

## 2019-05-04 NOTE — Telephone Encounter (Signed)
Rx for Maxalt ODT 10 mg prn placed electronically.

## 2019-05-04 NOTE — Telephone Encounter (Signed)
Received another PA request for rizatriptan ODT from [facility]. Completed PA via [place] [facility]. Confirmation #[id number] W.  Pt returned my call and I explained this to him. He understands the change to maxalt ODT dosing. I told him that he could pay OOP for it if he didn't want to wait for insurance coverage. He would prefer to wait until insurance coverage is guaranteed. I advised him that I will be in touch with him on Monday. Pt verbalized understanding.

## 2019-05-08 NOTE — Telephone Encounter (Signed)
I called pt to advise him that rizatriptan ODT was approved by Medicaid. He should contact his pharmacy for medication pick up. No answer, left a message asking him to call me back.

## 2019-05-08 NOTE — Telephone Encounter (Signed)
Medicaid approved rizatriptan ODT. Confirmed in [place] [facility].

## 2019-05-09 NOTE — Telephone Encounter (Signed)
Pt returned my call. I advised him that rizatriptan was approved by Medicaid. I reminded him of the  Rizatriptan ODT instructions. Pt verbalized understanding.

## 2019-05-09 NOTE — Telephone Encounter (Signed)
I called pt again to discuss. No answer, left a message asking him to call me back. 

## 2019-05-10 ENCOUNTER — Other Ambulatory Visit: Payer: Self-pay

## 2019-05-10 ENCOUNTER — Other Ambulatory Visit: Payer: Medicaid Other

## 2019-05-14 ENCOUNTER — Other Ambulatory Visit: Payer: Self-pay | Admitting: Family Medicine

## 2019-05-15 NOTE — Telephone Encounter (Signed)
Pt called in and asked to speak with Meagan but the call was sent to me. I spoke with pt. He reports that he does not want to complete a sleep study at this time.  He also reports that he thinks he had a seizure yesterday. Since we have not evaluated pt for seizures I recommended that he go to the ER when he has seizure-like activity. Pt verbalized understanding.

## 2019-05-15 NOTE — Telephone Encounter (Signed)
Rec'd notification that patient does not want to schedule sleep study at this time. I have cancelled order and will file away for if pt changes his mind.

## 2019-05-15 NOTE — Telephone Encounter (Signed)
Nothing further needed, I had talked to him at length about diagnostic work-up of new onset headaches, will proceed with brain MRI and will talk to him about sleep study testing at the next visit.  For any New, severe, or acute issues he will have to go to the emergency room, you have already talked to him about it, thank you.

## 2019-05-16 ENCOUNTER — Ambulatory Visit: Payer: Medicaid Other | Admitting: Nurse Practitioner

## 2019-05-25 ENCOUNTER — Other Ambulatory Visit: Payer: Self-pay | Admitting: Nurse Practitioner

## 2019-05-25 ENCOUNTER — Other Ambulatory Visit: Payer: Self-pay | Admitting: Neurology

## 2019-05-25 ENCOUNTER — Other Ambulatory Visit: Payer: Self-pay | Admitting: Physician Assistant

## 2019-05-25 DIAGNOSIS — I10 Essential (primary) hypertension: Secondary | ICD-10-CM

## 2019-05-25 DIAGNOSIS — G43009 Migraine without aura, not intractable, without status migrainosus: Secondary | ICD-10-CM

## 2019-06-08 ENCOUNTER — Other Ambulatory Visit: Payer: Medicaid Other

## 2019-06-29 ENCOUNTER — Other Ambulatory Visit: Payer: Self-pay | Admitting: Family Medicine

## 2019-06-29 DIAGNOSIS — G43009 Migraine without aura, not intractable, without status migrainosus: Secondary | ICD-10-CM

## 2019-06-29 NOTE — Telephone Encounter (Signed)
Please fill if appropriate. Last refill listed for 06/26/2019

## 2019-07-29 ENCOUNTER — Other Ambulatory Visit: Payer: Self-pay | Admitting: Family Medicine

## 2019-07-29 DIAGNOSIS — G43009 Migraine without aura, not intractable, without status migrainosus: Secondary | ICD-10-CM

## 2019-08-08 ENCOUNTER — Other Ambulatory Visit: Payer: Self-pay | Admitting: Gastroenterology

## 2019-08-30 ENCOUNTER — Other Ambulatory Visit: Payer: Self-pay | Admitting: Family Medicine

## 2019-08-30 ENCOUNTER — Other Ambulatory Visit: Payer: Self-pay | Admitting: Neurology

## 2019-08-30 ENCOUNTER — Other Ambulatory Visit: Payer: Self-pay | Admitting: Nurse Practitioner

## 2019-08-30 DIAGNOSIS — G43009 Migraine without aura, not intractable, without status migrainosus: Secondary | ICD-10-CM

## 2019-08-30 DIAGNOSIS — F411 Generalized anxiety disorder: Secondary | ICD-10-CM

## 2019-08-31 ENCOUNTER — Other Ambulatory Visit: Payer: Self-pay | Admitting: Family Medicine

## 2019-08-31 DIAGNOSIS — I10 Essential (primary) hypertension: Secondary | ICD-10-CM

## 2019-09-11 ENCOUNTER — Telehealth: Payer: Self-pay

## 2019-09-11 NOTE — Telephone Encounter (Signed)
Prior auth. submitted with [facility] for Dexilant 60mg  one a day. Faxed form on 09-11-2019.

## 2019-09-14 ENCOUNTER — Other Ambulatory Visit: Payer: Self-pay

## 2019-09-15 MED ORDER — ZOLOFT 100 MG PO TABS: 200.0000 mg | ORAL_TABLET | Freq: Every day | ORAL | 3 refills | Status: AC

## 2019-09-21 NOTE — Telephone Encounter (Signed)
Per pharmacy, still no response from [place] [facility]

## 2019-09-22 ENCOUNTER — Other Ambulatory Visit: Payer: Self-pay | Admitting: Neurology

## 2019-09-25 ENCOUNTER — Other Ambulatory Visit: Payer: Self-pay | Admitting: Family Medicine

## 2019-09-25 ENCOUNTER — Other Ambulatory Visit: Payer: Self-pay | Admitting: Nurse Practitioner

## 2019-09-25 DIAGNOSIS — G43009 Migraine without aura, not intractable, without status migrainosus: Secondary | ICD-10-CM

## 2019-09-25 DIAGNOSIS — F411 Generalized anxiety disorder: Secondary | ICD-10-CM

## 2019-10-02 NOTE — Telephone Encounter (Signed)
This was approved on 09-11-2019 for 1 year. 30 for 30 days. [place] [id number]. Pharmacy was updated of this

## 2019-10-19 ENCOUNTER — Other Ambulatory Visit: Payer: Self-pay | Admitting: Family Medicine

## 2019-10-19 ENCOUNTER — Other Ambulatory Visit: Payer: Self-pay | Admitting: Nurse Practitioner

## 2019-10-19 DIAGNOSIS — G43009 Migraine without aura, not intractable, without status migrainosus: Secondary | ICD-10-CM

## 2019-10-19 DIAGNOSIS — F411 Generalized anxiety disorder: Secondary | ICD-10-CM

## 2019-10-27 ENCOUNTER — Other Ambulatory Visit: Payer: Self-pay | Admitting: Family Medicine

## 2019-10-27 ENCOUNTER — Other Ambulatory Visit: Payer: Self-pay | Admitting: Neurology

## 2019-10-27 ENCOUNTER — Other Ambulatory Visit: Payer: Self-pay | Admitting: Gastroenterology

## 2019-10-27 DIAGNOSIS — G43009 Migraine without aura, not intractable, without status migrainosus: Secondary | ICD-10-CM

## 2019-10-29 ENCOUNTER — Other Ambulatory Visit: Payer: Self-pay | Admitting: Gastroenterology

## 2019-11-03 ENCOUNTER — Other Ambulatory Visit: Payer: Self-pay | Admitting: Physician Assistant

## 2019-11-03 DIAGNOSIS — F17200 Nicotine dependence, unspecified, uncomplicated: Secondary | ICD-10-CM

## 2019-11-06 ENCOUNTER — Other Ambulatory Visit: Payer: Self-pay | Admitting: Neurology

## 2019-11-07 ENCOUNTER — Other Ambulatory Visit: Payer: Self-pay | Admitting: Nurse Practitioner

## 2019-11-07 ENCOUNTER — Other Ambulatory Visit: Payer: Self-pay | Admitting: Neurology

## 2019-11-07 DIAGNOSIS — F411 Generalized anxiety disorder: Secondary | ICD-10-CM

## 2019-11-26 ENCOUNTER — Other Ambulatory Visit: Payer: Self-pay | Admitting: Neurology

## 2019-11-26 ENCOUNTER — Other Ambulatory Visit: Payer: Self-pay | Admitting: Family Medicine

## 2019-11-26 DIAGNOSIS — G43009 Migraine without aura, not intractable, without status migrainosus: Secondary | ICD-10-CM

## 2019-12-28 ENCOUNTER — Other Ambulatory Visit: Payer: Self-pay | Admitting: Physician Assistant

## 2019-12-28 ENCOUNTER — Other Ambulatory Visit: Payer: Self-pay | Admitting: Neurology

## 2019-12-28 DIAGNOSIS — F17200 Nicotine dependence, unspecified, uncomplicated: Secondary | ICD-10-CM

## 2020-01-09 ENCOUNTER — Other Ambulatory Visit: Payer: Self-pay | Admitting: Gastroenterology

## 2020-01-12 ENCOUNTER — Inpatient Hospital Stay: Admission: RE | Admit: 2020-01-12 | Payer: Medicaid Other | Source: Ambulatory Visit

## 2020-01-17 ENCOUNTER — Other Ambulatory Visit: Payer: Self-pay | Admitting: Neurology

## 2020-02-08 ENCOUNTER — Other Ambulatory Visit: Payer: Self-pay | Admitting: Neurology

## 2020-02-08 ENCOUNTER — Other Ambulatory Visit: Payer: Self-pay | Admitting: Gastroenterology

## 2020-03-08 ENCOUNTER — Other Ambulatory Visit: Payer: Self-pay | Admitting: Gastroenterology

## 2020-04-28 ENCOUNTER — Other Ambulatory Visit: Payer: Self-pay | Admitting: Family Medicine

## 2020-04-28 NOTE — Telephone Encounter (Signed)
Requested medication (s) are due for refill today: Yes  Requested medication (s) are on the active medication list: Yes  Last refill:  05/2019  Future visit scheduled: No  Notes to clinic:  Unable to refill, failed items on protocol, unsure if PCP wants to renew     Requested Prescriptions  Pending Prescriptions Disp Refills   Vitamin D, Ergocalciferol, (DRISDOL) 1.25 MG (50000 UNIT) CAPS capsule [Pharmacy Med Name: VITAMIN D2 1.25MG (50,000 UNIT)] 12 capsule 0    Sig: TAKE 1 CAPSULE BY MOUTH EVERY 7 DAYS      Endocrinology:  Vitamins - Vitamin D Supplementation Failed - 04/28/2020  8:00 PM      Failed - 50,000 IU strengths are not delegated      Failed - Ca in normal range and within 360 days    Calcium  Date Value Ref Range Status  03/24/2019 9.5 8.7 - 10.2 mg/dL Final          Failed - Phosphate in normal range and within 360 days    No results found for: PHOS        Failed - Vitamin D in normal range and within 360 days    Vit D, 25-Hydroxy  Date Value Ref Range Status  03/24/2019 20.2 (L) 30.0 - 100.0 ng/mL Final    Comment:    Vitamin D deficiency has been defined by the Institute of Medicine and an Endocrine Society practice guideline as a level of serum 25-OH vitamin D less than 20 ng/mL (1,2). The Endocrine Society went on to further define vitamin D insufficiency as a level between 21 and 29 ng/mL (2). 1. IOM (Institute of Medicine). 2010. Dietary reference    intakes for calcium and D. [place]: The    [employer]. 2. Holick MF, Binkley [place], Bischoff-Ferrari HA, et al.    Evaluation, treatment, and prevention of vitamin D    deficiency: an Endocrine Society clinical practice    guideline. JCEM. 2011 Jul; 96(7):1911-30.           Failed - Valid encounter within last 12 months    Recent Outpatient Visits           1 year ago Essential hypertension   [place], Zelda W, NP   1 year ago Fatigue,  unspecified type   [place], [place], [place]

## 2020-05-16 ENCOUNTER — Other Ambulatory Visit: Payer: Self-pay | Admitting: Family Medicine

## 2020-05-16 NOTE — Telephone Encounter (Signed)
Requested  medications are  due for refill today yes  Requested medications are on the active medication list yes  Last refill 6/19  Last visit 03/2020  Future visit scheduled no  Notes to clinic Not Delegated

## 2020-06-02 ENCOUNTER — Other Ambulatory Visit: Payer: Self-pay | Admitting: Gastroenterology

## 2020-06-14 ENCOUNTER — Other Ambulatory Visit: Payer: Self-pay | Admitting: Family Medicine

## 2020-06-14 ENCOUNTER — Other Ambulatory Visit: Payer: Self-pay | Admitting: Neurology

## 2020-06-14 NOTE — Telephone Encounter (Signed)
Requested medication (s) are due for refill today - no  Requested medication (s) are on the active medication list -yes  Future visit scheduled -no  Last refill: 05/26/19  Notes to clinic: Request RF for expired Rx-non delegated  Requested Prescriptions  Pending Prescriptions Disp Refills   Vitamin D, Ergocalciferol, (DRISDOL) 1.25 MG (50000 UNIT) CAPS capsule [Pharmacy Med Name: VITAMIN D2 1.25MG (50,000 UNIT)] 12 capsule 0    Sig: TAKE 1 CAPSULE BY MOUTH EVERY 7 DAYS      Endocrinology:  Vitamins - Vitamin D Supplementation Failed - 06/14/2020  3:54 PM      Failed - 50,000 IU strengths are not delegated      Failed - Ca in normal range and within 360 days    Calcium  Date Value Ref Range Status  03/24/2019 9.5 8.7 - 10.2 mg/dL Final          Failed - Phosphate in normal range and within 360 days    No results found for: PHOS        Failed - Vitamin D in normal range and within 360 days    Vit D, 25-Hydroxy  Date Value Ref Range Status  03/24/2019 20.2 (L) 30.0 - 100.0 ng/mL Final    Comment:    Vitamin D deficiency has been defined by the Institute of Medicine and an Endocrine Society practice guideline as a level of serum 25-OH vitamin D less than 20 ng/mL (1,2). The Endocrine Society went on to further define vitamin D insufficiency as a level between 21 and 29 ng/mL (2). 1. IOM (Institute of Medicine). 2010. Dietary reference    intakes for calcium and D. [place]: The    [employer]. 2. Holick MF, Binkley , Bischoff-Ferrari HA, et al.    Evaluation, treatment, and prevention of vitamin D    deficiency: an Endocrine Society clinical practice    guideline. JCEM. 2011 Jul; 96(7):1911-30.           Failed - Valid encounter within last 12 months    Recent Outpatient Visits           1 year ago Essential hypertension   [place], [place] W, NP   1 year ago Fatigue, unspecified type   [place], [place], [place]                  Requested Prescriptions  Pending Prescriptions Disp Refills   Vitamin D, Ergocalciferol, (DRISDOL) 1.25 MG (50000 UNIT) CAPS capsule [Pharmacy Med Name: VITAMIN D2 1.25MG (50,000 UNIT)] 12 capsule 0    Sig: TAKE 1 CAPSULE BY MOUTH EVERY 7 DAYS      Endocrinology:  Vitamins - Vitamin D Supplementation Failed - 06/14/2020  3:54 PM      Failed - 50,000 IU strengths are not delegated      Failed - Ca in normal range and within 360 days    Calcium  Date Value Ref Range Status  03/24/2019 9.5 8.7 - 10.2 mg/dL Final          Failed - Phosphate in normal range and within 360 days    No results found for: PHOS        Failed - Vitamin D in normal range and within 360 days    Vit D, 25-Hydroxy  Date Value Ref Range Status  03/24/2019 20.2 (L) 30.0 - 100.0 ng/mL Final    Comment:    Vitamin D deficiency  has been defined by the [place] practice guideline as a level of serum 25-OH vitamin D less than 20 ng/mL (1,2). The Endocrine Society went on to further define vitamin D insufficiency as a level between 21 and 29 ng/mL (2). 1. IOM (Institute of Medicine). 2010. Dietary reference    intakes for calcium and D. [place]: The    [employer]. 2. Holick MF, Binkley [place], Bischoff-Ferrari HA, et al.    Evaluation, treatment, and prevention of vitamin D    deficiency: an Endocrine Society clinical practice    guideline. JCEM. 2011 Jul; 96(7):1911-30.           Failed - Valid encounter within last 12 months    Recent Outpatient Visits           1 year ago Essential hypertension   [place], Zelda W, NP   1 year ago Fatigue, unspecified type   [place], [place], [place]

## 2020-08-02 ENCOUNTER — Other Ambulatory Visit: Payer: Self-pay | Admitting: Neurology

## 2020-08-02 ENCOUNTER — Other Ambulatory Visit: Payer: Self-pay | Admitting: Family Medicine

## 2020-08-02 NOTE — Telephone Encounter (Signed)
Requested medication (s) are due for refill today: Yes  Requested medication (s) are on the active medication list: Yes  Last refill:  05/26/19  Future visit scheduled: No  Notes to clinic:  See request.    Requested Prescriptions  Pending Prescriptions Disp Refills   Vitamin D, Ergocalciferol, (DRISDOL) 1.25 MG (50000 UNIT) CAPS capsule [Pharmacy Med Name: VITAMIN D2 1.25MG (50,000 UNIT)] 12 capsule 0    Sig: TAKE 1 CAPSULE BY MOUTH EVERY 7 DAYS      Endocrinology:  Vitamins - Vitamin D Supplementation Failed - 08/02/2020  2:48 PM      Failed - 50,000 IU strengths are not delegated      Failed - Ca in normal range and within 360 days    Calcium  Date Value Ref Range Status  03/24/2019 9.5 8.7 - 10.2 mg/dL Final          Failed - Phosphate in normal range and within 360 days    No results found for: PHOS        Failed - Vitamin D in normal range and within 360 days    Vit D, 25-Hydroxy  Date Value Ref Range Status  03/24/2019 20.2 (L) 30.0 - 100.0 ng/mL Final    Comment:    Vitamin D deficiency has been defined by the Institute of Medicine and an Endocrine Society practice guideline as a level of serum 25-OH vitamin D less than 20 ng/mL (1,2). The Endocrine Society went on to further define vitamin D insufficiency as a level between 21 and 29 ng/mL (2). 1. IOM (Institute of Medicine). 2010. Dietary reference    intakes for calcium and D. [place]: The    [employer]. 2. Holick MF, Binkley , Bischoff-Ferrari HA, et al.    Evaluation, treatment, and prevention of vitamin D    deficiency: an Endocrine Society clinical practice    guideline. JCEM. 2011 Jul; 96(7):1911-30.           Failed - Valid encounter within last 12 months    Recent Outpatient Visits           1 year ago Essential hypertension   [place], [facility] [place], [facility]   1 year ago Fatigue, unspecified type   [place]  [place], [facility], [facility]

## 2020-08-06 NOTE — Telephone Encounter (Signed)
Patient no longer under care at [facility].

## 2020-08-08 ENCOUNTER — Other Ambulatory Visit: Payer: Self-pay | Admitting: Family Medicine

## 2020-08-08 NOTE — Telephone Encounter (Signed)
Requested medications are due for refill today yes  Requested medications are on the active medication list yes  Last refill 02/10/20  Last visit 03/13/2019  Future visit scheduled no  Notes to clinic Not Delegated

## 2020-08-24 ENCOUNTER — Other Ambulatory Visit: Payer: Self-pay | Admitting: Neurology
